# Patient Record
Sex: Male | Born: 1950 | Race: Black or African American | Hispanic: No | Marital: Married | Smoking: Current every day smoker
Health system: Southern US, Community
[De-identification: ages and names within clinical notes are randomized; demographics above are authoritative.]

## PROBLEM LIST (undated history)

## (undated) VITALS — BP 107/74 | HR 80 | Temp 98.1°F | Resp 16 | Ht 64.75 in | Wt 140.5 lb

## (undated) DIAGNOSIS — K219 Gastro-esophageal reflux disease without esophagitis: Secondary | ICD-10-CM

## (undated) DIAGNOSIS — E039 Hypothyroidism, unspecified: Secondary | ICD-10-CM

## (undated) DIAGNOSIS — R42 Dizziness and giddiness: Secondary | ICD-10-CM

## (undated) DIAGNOSIS — R351 Nocturia: Secondary | ICD-10-CM

## (undated) DIAGNOSIS — N200 Calculus of kidney: Secondary | ICD-10-CM

## (undated) DIAGNOSIS — F1020 Alcohol dependence, uncomplicated: Secondary | ICD-10-CM

## (undated) DIAGNOSIS — E079 Disorder of thyroid, unspecified: Secondary | ICD-10-CM

## (undated) DIAGNOSIS — I10 Essential (primary) hypertension: Secondary | ICD-10-CM

## (undated) DIAGNOSIS — F1910 Other psychoactive substance abuse, uncomplicated: Secondary | ICD-10-CM

## (undated) HISTORY — DX: Calculus of kidney: N20.0

## (undated) HISTORY — DX: Disorder of thyroid, unspecified: E07.9

## (undated) HISTORY — DX: Alcohol dependence, uncomplicated: F10.20

## (undated) HISTORY — DX: Other psychoactive substance abuse, uncomplicated: F19.10

---

## 1998-02-05 ENCOUNTER — Emergency Department (HOSPITAL_COMMUNITY): Admission: EM | Admit: 1998-02-05 | Discharge: 1998-02-05 | Payer: Self-pay | Admitting: Emergency Medicine

## 2004-01-25 ENCOUNTER — Emergency Department (HOSPITAL_COMMUNITY): Admission: EM | Admit: 2004-01-25 | Discharge: 2004-01-25 | Payer: Self-pay | Admitting: Emergency Medicine

## 2004-07-10 ENCOUNTER — Inpatient Hospital Stay (HOSPITAL_COMMUNITY): Admission: EM | Admit: 2004-07-10 | Discharge: 2004-07-14 | Payer: Self-pay | Admitting: Emergency Medicine

## 2005-06-11 ENCOUNTER — Ambulatory Visit: Payer: Self-pay | Admitting: Psychiatry

## 2005-06-11 ENCOUNTER — Inpatient Hospital Stay (HOSPITAL_COMMUNITY): Admission: RE | Admit: 2005-06-11 | Discharge: 2005-06-14 | Payer: Self-pay | Admitting: Psychiatry

## 2009-12-17 ENCOUNTER — Emergency Department (HOSPITAL_COMMUNITY): Admission: EM | Admit: 2009-12-17 | Discharge: 2009-12-17 | Payer: Self-pay | Admitting: Emergency Medicine

## 2010-11-24 ENCOUNTER — Inpatient Hospital Stay (INDEPENDENT_AMBULATORY_CARE_PROVIDER_SITE_OTHER)
Admission: RE | Admit: 2010-11-24 | Discharge: 2010-11-24 | Disposition: A | Discharge status: Home / Self Care | Payer: Managed Care, Other (non HMO) | Source: Ambulatory Visit | Attending: Family Medicine | Admitting: Family Medicine

## 2010-11-24 DIAGNOSIS — M1990 Unspecified osteoarthritis, unspecified site: Secondary | ICD-10-CM

## 2011-02-23 NOTE — Procedures (Signed)
CONCLUSION:  Abnormal EEG demonstrating excessive beta activity which may be  reflecting the Ativan.  No definite seizure discharges were noted and no  definite focal abnormalities were identified.  Clinical correlation is  recommended.      NGE:XBMW  D:  07/11/2004 15:44:25  T:  07/11/2004 16:13:55  Job #:  [id number]

## 2011-02-23 NOTE — H&P (Signed)
NAME:  JASHAD, DEPAULA NO.:  [id number]   MEDICAL RECORD NO.:  [id number]          PATIENT TYPE:  INP   LOCATION:  1831                         FACILITY:  [facility]   PHYSICIAN:  Sherin Quarry, MD      DATE OF BIRTH:  [birth year]   DATE OF ADMISSION:  07/10/2004  DATE OF DISCHARGE:                                HISTORY & PHYSICAL   HISTORY OF PRESENT ILLNESS:  Hutchinson Isenberg is a 60 year old man with a  longstanding history of alcohol abuse.  According to his wife, he has been  drinking especially heavy since last October.  She attributes this to the  death of his dog.  Remarkably, he continues to work at the [employer].  Two weeks ago he was admitted to [facility] where he remained for 4 days.  After getting out, his wife says that he abstained from alcohol for about 5  days and then resumed drinking very heavily, about a fifth per day.  He ran  out of hard liquor on Saturday.  On Sunday he consumed only three beers,  which represents a tremendous decrease in alcohol consumption.  Today, as he  was going to work, he did not drink any alcohol.  While at work, around 9  a.m., he experienced a generalized tonic-clonic seizure.  He was brought to  the emergency room where a second seizure occurred.  This was described as  well as a generalized tonic-clonic seizure lasting about 2 minutes.  He bit  his tongue during this episode.  He was administered 2 mg of Ativan IV.  Currently, he is somewhat lethargic but is alert.  He denies any recent  history of headache, fever, ear pain, sinus pain, coughing, breathing  difficulty, chest pain, nausea, or vomiting.  In the emergency room he has  also been noted to have an elevated blood pressure.  He states that he  believes he was told in the past that his blood pressure was elevated but he  has never received any treatment.  His wife says that he basically never  goes to doctors.  He is admitted at this time for evaluation of  what is  presumed to be an alcohol-withdrawal seizure.   PAST MEDICAL HISTORY:  1.  Medications:  None.  2.  Allergies:  None.  3.  Operations:  He recalls one occasion where he had a laceration repaired;      otherwise, he has essentially had no operations.  4.  Medical illnesses:  None except as noted above.   FAMILY HISTORY:  His father died as a result of COPD.  His mother was  treated for lung cancer.  He states that his siblings are in good health.   SOCIAL HISTORY:  He smokes one pack of cigarettes per day.  His alcohol  consumption is as described above.  He does not abuse drugs.  He lives with  his wife, who is very concerned about his well-being.   REVIEW OF SYSTEMS:  HEAD:  He denies headache or dizziness.  EYES:  He  denies visual blurring  or diplopia.  EAR, NOSE, AND THROAT:  Denies earache,  sinus pain, or sore throat.  CHEST:  Denies coughing, wheezing, or chest  congestion.  CARDIOVASCULAR:  Denies orthopnea, PND, or ankle edema.  GI:  Denies nausea, vomiting, abdominal pain, change in bowel habits, melena, or  hematochezia.  GU:  Denies dysuria or urinary frequency.  NEURO:  See above.  ENDO:  There is no history of excessive thirst, urinary frequency, nocturia,  heat or cold intolerance, skin or hair changes.   PHYSICAL EXAMINATION:  GENERAL:  He is a somewhat lethargic but otherwise  alert gentleman.  VITAL SIGNS:  His blood pressure is currently 168/95, pulse is 100,  respirations are 12 and unlabored.  HEENT:  Within normal limits.  CHEST:  Clear to auscultation and percussion.  CARDIOVASCULAR:  Reveals normal S1 and S2 without rubs, murmurs, or gallops.  ABDOMEN:  Benign.  There are normal bowel sounds with masses or tenderness.  There is no guarding or rebound.  NEUROLOGIC:  Cranial nerves, motor, sensory, and cerebellar testing is  normal.  Station and gait was not tested.  EXTREMITIES:  Revealed no evidence of cyanosis or edema.   LABORATORY DATA:   Laboratory studies were reviewed.  Chest x-ray was normal.  The patient had not had a CT scan of the brain as yet.   IMPRESSION:  1.  Alcohol-withdrawal seizures.  2.  Severe alcohol abuse.  3.  Probable essential hypertension.  4.  A 50 pack-year smoking history.   The patient will be admitted to the hospital at this time.  He will be  administered Ativan and thiamine by the intravenous route per Ativan alcohol  withdrawal protocol.  He was given 0.2 mg of p.o. Catapres and will be given  Catapres to maintain blood pressure in a reasonable range.  It will be  important to obtain a CT scan of the head to be sure that there are no signs  of any subdural blood collections, tumors, etc.  On a long-term basis, he  clearly needs to refrain from alcohol consumption.  It does not sound like  his recent visit to [facility] was very effective.  He also needs to follow up with  Dr. Manus Gunning in regards to his blood pressure.       SY/MEDQ  D:  07/10/2004  T:  07/10/2004  Job:  [id number]   cc:   Bryan Lemma. Manus Gunning, M.D.  [address] [place]  [place] [address]  Fax: [phone]

## 2011-02-23 NOTE — Discharge Summary (Signed)
NAME:  Philip [name], Philip [name] NO.:  [id number]   MEDICAL RECORD NO.:  [id number]          PATIENT TYPE:  IPS   LOCATION:  0302                          FACILITY[facility]  [facility]   PHYSICIAN:  Jeanice Lim, M.D. DATE OF BIRTH:  January 10, 1951   DATE OF ADMISSION:  06/11/2005  DATE OF DISCHARGE:  06/14/2005                                 DISCHARGE SUMMARY   IDENTIFYING DATA:  This is a 60 year old African-American male, married,  voluntarily admitted, with history of alcohol abuse for many years, relapsed  on alcohol after problems at work.  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  History of a seizure  disorder, off all medications for at least 6 months, wants to remain clean  and sober.  History of DT symptoms in the past.  Again, medical history  significant for seizure disorder, hypertension, and not taking seizure  medications.   ADMISSION MEDICATIONS:  Medications the patient is supposed to be on:  Dilantin, hydrochlorothiazide, Atenolol, and again off for 6 months.   ALLERGIES:  No known drug allergies.   PHYSICAL AND NEUROLOGICAL EXAMINATION:  Within normal limits.   ROUTINE ADMISSION LABS:  Within normal limits.   MENTAL STATUS EXAM:  Fully alert, calm, cooperative, blunted affect, some  tearfulness.  Speech normal, mood depressed, thought process goal directed,  a lot of shame and guilt regarding alcohol use, cognitively intact.  Judgment and insight were fair.   ADMISSION DIAGNOSES:  AXIS I:  Alcohol abuse versus dependence, likely  history of alcohol dependence, and cocaine abuse, history of polysubstance  abuse, possible substance-induced mood disorder versus depressive disorder  not otherwise specified.  AXIS II:  None.  AXIS III:  Seizure disorder, hypertension, hypokalemia.  AXIS IV:  Severe, problems with occupation, may lose  job, financial stress,  and limited support system.  AXIS V:  30/60.   HOSPITAL COURSE:  The patient was admitted and ordered routine p.r.n.  medications, underwent further monitoring, and was encouraged to participate  in individual, group and milieu therapy.  The patient was placed on seizure  precautions, potassium repleted, hydrochlorothiazide initially held,  Dilantin restarted and level checked.  The patient was given thiamine and  monitored for withdrawal symptoms due to high risk.  The patient reported  doing better, with improvement in sleep and appetite, had no complications  of withdrawal including no seizure activity, and was discharged in improved  condition.  Mood was euthymic, affect full, no psychotic symptoms or  dangerous ideation, no acute withdrawal symptoms.  The patient reported  motivation to be compliant with followup and remain abstinent.  He again was  given medication education, emphasized regarding the importance of  compliance with medications and he was discharged on:  1.  Dilantin 100 mg b.i.d.  2.  Tenormin 25 mg b.i.d.  3.  Hydrochlorothiazide 25 mg daily.  4.  Trazodone 75 mg q.h.s.   DISPOSITION:  The patient was to follow up with  AA.  The patient refused any  other formal substance abuse followup at this time but reported he had done  well with AA in the past and intended to be very involved again.  The  patient's condition was improved, prognosis was guarded in light of the  patient's unwillingness to set up a more comprehensive aftercare plan.      Jeanice Lim, M.D.  Electronically Signed     JEM/MEDQ  D:  07/29/2005  T:  07/29/2005  Job:  [id number]

## 2011-02-23 NOTE — Discharge Summary (Signed)
NAME:  Philip [name], GAPPA NO.:  [id number]   MEDICAL RECORD NO.:  [id number]          PATIENT TYPE:  INP   LOCATION:  5019                         FACILITY:  [facility]   PHYSICIAN:  Hollice Espy, M.D.DATE OF BIRTH:  Sep 24, 1951   DATE OF ADMISSION:  07/10/2004  DATE OF DISCHARGE:  07/14/2004                                 DISCHARGE SUMMARY   PRIMARY CARE PHYSICIAN:  The patient sees Dr. Maurice Small.   DISCHARGE DIAGNOSES:  1.  Alcohol abuse and withdrawal.  2.  Seizures secondary to #1.  3.  History of hypertension.  4.  History of tobacco abuse.   DISCHARGE MEDICATIONS FOR THIS PATIENT ARE AS FOLLOWS:  1.  Thiamine 100 mg p.o. daily.  2.  Multivitamin p.o. daily.  3.  Folic acid 1 mg p.o. daily.  4.  Dilantin 300 mg p.o. q.h.s.  5.  Ativan 1 mg p.o. on July 15, 2004.   Patient is a 60 year old African-American male with a past medical history  of alcohol abuse who was found passed out at work this morning.  He did not  remember falling or what had happened prior to his fall.  He was brought in  to the emergency room where he had a witnessed seizure.  The patient had a  history of alcohol abuse and had previous history of withdrawals when he  would stop drinking.  Patient apparently began drinking 7 days ago a fifth  of liquor and he also uses cocaine, most recently that morning.  Patient was  admitted for alcohol withdrawal, seizures likely secondary to his  withdrawal.  In addition, a CT scan of his head showed a possible right  frontal hemorrhage.  Neurology, Dr. Pearlean Brownie, was consulted to see if this was  some type of hemorrhage causing seizures or more of an alcohol withdrawal  seizure as well.  The patient was evaluated in the emergency room by  neurology.  He was loaded with Dilantin and continued on a continued dose.  In addition, he also underwent a repeat head CT and EEG.  Patient was doing  relatively well; however, he still remained unsteady  and he was found out of  his bed delirious on the second day.  He was continued on the Ativan  protocol.  He had no further seizures during this time.  An EEG was done as  well as a repeat CT of the head.  The EEG showed excessive beta activity,  otherwise fairly unremarkable.  CT of the head confirmed a contusion at the  right frontal area.  It was felt then that his new onset seizure disorder  was felt to be secondary to a cerebral contusion, alcohol withdrawal and  cocaine binging multifactorial.  The patient was on a tapering dose of  Ativan which he tolerated well, his confusion was improving and by July 14, 2004, he was able to be upright and alert, he was able to be ambulated  well without assistance and felt to be medically stable for discharge.   PLAN:  For him to followup with [facility], which  he has been given  numbers for.  Continue his medications as well as Ativan taper for one more  day as supervised by his wife.  He is, obviously, advised that he can longer  drive, and I have spoken with his wife about that it would be illegal for  him to drive.  She says she  understands this and she will make sure that he obtains a ride from a friend  or from her when trying to get around.  In addition, patient's disposition  is improved, activity will be as tolerated and he will followup with  [facility].  He is being discharged to home.      Send   SKK/MEDQ  D:  08/12/2004  T:  08/13/2004  Job:  [id number]

## 2011-02-23 NOTE — Consult Note (Signed)
NAME:  Philip [name], Philip [name]               ACCOUNT NO.:  [id number]   MEDICAL RECORD NO.:  [id number]          PATIENT TYPE:  INP   LOCATION:  3109                         FACILITY:  [facility]   PHYSICIAN:  Pramod P. Pearlean Brownie, MD    DATE OF BIRTH:  1951-02-06   DATE OF CONSULTATION:  07/10/2004  DATE OF DISCHARGE:                                   CONSULTATION   CONSULTING PHYSICIAN:  Pramod P. Pearlean Brownie, M.D.   REFERRING PHYSICIAN:  Sherin Quarry, M.D.   REASON FOR CONSULTATION:  The patient is a 65 year old African-American  gentleman who was found unresponsive at work today.  He was found to be  drowsy and moving all four extremities. Upon arrival in the emergency room,  he had a second witnessed generalized tonic clonic seizure.  He has since  been post ictal and slowly improving.  The patient does have a history of  drug abuse and admits to having snorted cocaine this a.m. His urine drug  screen was positive for cocaine, but an alcohol level was not detectable.  The patient does have a history of heavy alcohol abuse and admits to  drinking about a fifth of alcohol every day.  He has a remote history of  seizures about 20 years ago, but since then he has been seizure free and not  on any medications for seizures.   PAST MEDICAL HISTORY:  1.  Heavy alcohol abuse.  2.  Substance abuse.  3.  Hypertension.  4.  Smoking.   MEDICATIONS:  None.   ALLERGIES:  None.   PAST SURGICAL HISTORY:  None.   REVIEW OF SYMPTOMS:  GENERAL:  Not significant for recent fever, cough,  chest pain or diarrhea. PSYCHIATRIC:  He does have a history of giving up  alcohol, but restarted a few weeks ago.   FAMILY HISTORY:  Noncontributory.   PHYSICAL EXAMINATION:  GENERAL: Physical examination reveals a pleasant  African-American gentleman who is not in distress.  VITAL SIGNS:  He is afebrile, pulse rate is 84, respiratory rate 16,  saturations are 98% on two liters of oxygen and blood pressure is 162/89.  HEENT:  The head is normocephalic, atraumatic.  The neck is supple without  bruit.  Nasotracheal exam is unremarkable.  CARDIAC EXAM:  No murmur or gallop.  LUNGS:  Clear to auscultation.  NEUROLOGIC EXAM:  The patient is drowsy, but opens his eyes and follows  commands well and appropriately.  There is no slurred speech, dysphagia or  dysarthria.  Pupils equal, round and reactive to light and accommodation.  Visual acuity appears adequate.  The face is symmetric.  The palate elevates  normally. The tongue is midline. Motor exam reveals no upper extremity  weakness or drift.  Symmetric strength, tone, reflexes, coordination and  sensation.  Plantars are downgoing. Gait was not tested.   LABORATORY DATA:  Chest x-ray is normal.   Blood glucose, creatinine and electrolytes are normal except for a low  potassium of 3.0.  Urine drug screen is positive for cocaine.  Alcohol level  is not detectable.   CT scan of the  head, non-contrast study, reveals a patchy high density in  the right frontal region which may be a small hemorrhage or contusion though  there is no underlying mass affect or midline shift.   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   PLAN:  1.  I would agree with treatment with phenytoin for his symptomatic      seizures.  2.  Continue phenytoin 300 mg a day and check level tomorrow morning.  3.  Aim for a level of 15 to 20 mg.  4.  Check an electroencephalogram.  5.  Repeat CT scan of the head in the morning to look for any interval      change in the contusion size.  6.  Also check CT angiogram of the brain to look for evidence of any      vasculitis.  7.  Consult the patient regarding alcohol and drugs.  8.  Watch for signs of alcohol withdrawal and treat appropriately.  9.  I  will be happy to follow the patient in consult.  Kindly call for      questions.  I had a long discussion with the patient and his wife      regarding his symptoms. I have discussed the plan for evaluation and      treatment.       PPS/MEDQ  D:  07/10/2004  T:  07/10/2004  Job:  [id number]   cc:   Sherin Quarry, MD

## 2011-03-23 ENCOUNTER — Inpatient Hospital Stay (INDEPENDENT_AMBULATORY_CARE_PROVIDER_SITE_OTHER)
Admission: RE | Admit: 2011-03-23 | Discharge: 2011-03-23 | Disposition: A | Discharge status: Home / Self Care | Payer: Managed Care, Other (non HMO) | Source: Ambulatory Visit | Attending: Family Medicine | Admitting: Family Medicine

## 2011-03-23 DIAGNOSIS — J069 Acute upper respiratory infection, unspecified: Secondary | ICD-10-CM

## 2011-04-24 ENCOUNTER — Other Ambulatory Visit: Payer: Self-pay | Admitting: Occupational Medicine

## 2011-04-24 ENCOUNTER — Ambulatory Visit: Payer: Self-pay

## 2011-04-24 DIAGNOSIS — R52 Pain, unspecified: Secondary | ICD-10-CM

## 2011-09-11 ENCOUNTER — Emergency Department (HOSPITAL_COMMUNITY)
Admission: EM | Admit: 2011-09-11 | Discharge: 2011-09-11 | Disposition: A | Discharge status: Home / Self Care | Payer: Managed Care, Other (non HMO) | Source: Home / Self Care

## 2011-10-30 ENCOUNTER — Ambulatory Visit (INDEPENDENT_AMBULATORY_CARE_PROVIDER_SITE_OTHER): Payer: BC Managed Care – PPO

## 2011-10-30 DIAGNOSIS — I10 Essential (primary) hypertension: Secondary | ICD-10-CM

## 2011-10-30 DIAGNOSIS — T65291A Toxic effect of other tobacco and nicotine, accidental (unintentional), initial encounter: Secondary | ICD-10-CM

## 2012-02-03 ENCOUNTER — Ambulatory Visit (INDEPENDENT_AMBULATORY_CARE_PROVIDER_SITE_OTHER): Payer: BC Managed Care – PPO | Admitting: Family Medicine

## 2012-02-03 VITALS — BP 150/88 | HR 71 | Temp 97.7°F | Resp 18 | Ht 66.5 in | Wt 142.0 lb

## 2012-02-03 DIAGNOSIS — I10 Essential (primary) hypertension: Secondary | ICD-10-CM

## 2012-02-03 LAB — POCT CBC
Granulocyte percent: 55.8 %G (ref 37–80)
HCT, POC: 46.8 % (ref 43.5–53.7)
Hemoglobin: 15.3 g/dL (ref 14.1–18.1)
Lymph, poc: 2.3 (ref 0.6–3.4)
MCH, POC: 27.2 pg (ref 27–31.2)
MCHC: 32.7 g/dL (ref 31.8–35.4)
MCV: 83.3 fL (ref 80–97)
MID (cbc): 0.5 (ref 0–0.9)
MPV: 8.2 fL (ref 0–99.8)
POC Granulocyte: 3.5 (ref 2–6.9)
POC LYMPH PERCENT: 36.8 %L (ref 10–50)
POC MID %: 7.4 %M (ref 0–12)
Platelet Count, POC: 426 10*3/uL — AB (ref 142–424)
RBC: 5.62 M/uL (ref 4.69–6.13)
RDW, POC: 14.1 %
WBC: 6.2 10*3/uL (ref 4.6–10.2)

## 2012-02-03 LAB — COMPREHENSIVE METABOLIC PANEL
ALT: 14 U/L (ref 0–53)
AST: 15 U/L (ref 0–37)
Albumin: 4.3 g/dL (ref 3.5–5.2)
Alkaline Phosphatase: 144 U/L — ABNORMAL HIGH (ref 39–117)
BUN: 13 mg/dL (ref 6–23)
CO2: 25 mEq/L (ref 19–32)
Calcium: 9.8 mg/dL (ref 8.4–10.5)
Chloride: 101 mEq/L (ref 96–112)
Creat: 0.57 mg/dL (ref 0.50–1.35)
Glucose, Bld: 98 mg/dL (ref 70–99)
Potassium: 4.4 mEq/L (ref 3.5–5.3)
Sodium: 136 mEq/L (ref 135–145)
Total Bilirubin: 0.3 mg/dL (ref 0.3–1.2)
Total Protein: 7.2 g/dL (ref 6.0–8.3)

## 2012-02-03 LAB — LDL CHOLESTEROL, DIRECT: Direct LDL: 100 mg/dL — ABNORMAL HIGH

## 2012-02-03 MED ORDER — LOSARTAN POTASSIUM-HCTZ 100-25 MG PO TABS: 1.0000 | ORAL_TABLET | Freq: Every day | ORAL | Status: DC

## 2012-02-03 NOTE — Progress Notes (Signed)
  Patient Name: Philip [name] Date of Birth: [birth year] Medical Record Number: [id number] Gender: male Date of Encounter: 02/03/2012  History of Present Illness:  Philip [name] is a 61 y.o. very pleasant male patient who presents with the following:  Here for a refill of his BP medication.  He has been taking his medication faithfully.  He has also stopped drinking for the last couple of months.  He continues to smoke but is working on stopping this too.  He does not do home checks of BP.  Not fasting currently  There is no problem list on file for this patient.  No past medical history on file. No past surgical history on file. History  Substance Use Topics  . Smoking status: Current Everyday Smoker -- 1.0 packs/day    Types: Cigarettes  . Smokeless tobacco: Not on file  . Alcohol Use: No     former drinker   No family history on file. No Known Allergies  Medication list has been reviewed and updated.  Review of Systems: As per HPI- otherwise negative.   Physical Examination: Filed Vitals:   02/03/12 1405 02/03/12 1416  BP: 175/94 150/88  Pulse: 71   Temp: 97.7 F (36.5 C)   TempSrc: Oral   Resp: 18   Height: 5' 6.5" (1.689 m)   Weight: 142 lb (64.411 kg)     Body mass index is 22.58 kg/(m^2).  GEN: WDWN, NAD, Non-toxic, A & O x 3, tobacco odor HEENT: Atraumatic, Normocephalic. Neck supple. No masses, No LAD. Ears and Nose: No external deformity. CV: RRR, No M/G/R. No JVD. No thrill. No extra heart sounds. PULM: CTA B, no wheezes, crackles, rhonchi. No retractions. No resp. distress. No accessory muscle use. ABD: S, NT, ND, +BS. No rebound. No HSM. EXTR: No c/c/e NEURO Normal gait.  PSYCH: Normally interactive. Conversant. Not depressed or anxious appearing.  Calm demeanor.    Assessment and Plan: 1. Hypertension  losartan-hydrochlorothiazide (HYZAAR) 100-25 MG per tablet, POCT CBC, Comprehensive metabolic panel, LDL cholesterol, direct    Increased  dose of medication as above.  From 100/12.5 to 100/25.  Please come and see [place] in a couple of months for a recheck, and good luck with quitting smoking!  He plans to try patches to achieve this goal

## 2012-02-04 ENCOUNTER — Encounter: Payer: Self-pay | Admitting: Family Medicine

## 2012-02-05 ENCOUNTER — Telehealth: Payer: Self-pay

## 2012-02-05 NOTE — Telephone Encounter (Signed)
PT STATES DR COPLAND CHANGED HIS BP MED, BUT HE SAYS WHEN HE WENT AND PICKED IT UP IT WAS 100-12.5. CAN WE JUST CALL THE PHARMACY AND FIX THIS?  [facility] [address]

## 2012-02-05 NOTE — Telephone Encounter (Signed)
Pt has questions about his blood pressure medication and that he believes that the miligrams should have been increased but when he picked them up from the pharmacy they were the same as before

## 2012-02-06 NOTE — Telephone Encounter (Signed)
Please call pharmacy and adjust dose.  Should be 100/25 per chart

## 2012-02-06 NOTE — Telephone Encounter (Signed)
[facility] and they corrected rx. Called pt and lmom for him.

## 2012-02-07 ENCOUNTER — Encounter: Payer: Self-pay | Admitting: Family Medicine

## 2012-03-02 ENCOUNTER — Other Ambulatory Visit: Payer: Self-pay | Admitting: Family Medicine

## 2012-03-05 ENCOUNTER — Other Ambulatory Visit: Payer: Self-pay | Admitting: Family Medicine

## 2012-04-14 ENCOUNTER — Ambulatory Visit (INDEPENDENT_AMBULATORY_CARE_PROVIDER_SITE_OTHER): Payer: BC Managed Care – PPO | Admitting: Family Medicine

## 2012-04-14 VITALS — BP 110/82 | HR 120 | Temp 98.0°F | Resp 18 | Ht 64.5 in | Wt 147.0 lb

## 2012-04-14 DIAGNOSIS — I10 Essential (primary) hypertension: Secondary | ICD-10-CM

## 2012-04-14 DIAGNOSIS — K5289 Other specified noninfective gastroenteritis and colitis: Secondary | ICD-10-CM

## 2012-04-14 DIAGNOSIS — K529 Noninfective gastroenteritis and colitis, unspecified: Secondary | ICD-10-CM

## 2012-04-14 MED ORDER — METRONIDAZOLE 250 MG PO TABS: 250.0000 mg | ORAL_TABLET | Freq: Two times a day (BID) | ORAL | Status: DC

## 2012-04-14 NOTE — Progress Notes (Signed)
61 yo man with 3 days of nausea, cramps, diarrhea and vomiting after eating at a cookout on July 5th.  He is now feeling better.  Cramps have subsided by diarrhea continues He had been weak, but is now better.  He works for [employer].  Still smoking, but has cut back a bit.  O:  Alert, NAD HEENT:  Unremarkable Chest:  Clear Heart: reg, no murmur Abd:  No bruit, soft, hyperactive BS Ext:  No edema  A:  Hypertension, gastroenteritis  1. Gastroenteritis  metroNIDAZOLE (FLAGYL) 250 MG tablet

## 2012-04-21 ENCOUNTER — Ambulatory Visit (INDEPENDENT_AMBULATORY_CARE_PROVIDER_SITE_OTHER): Payer: BC Managed Care – PPO | Admitting: Family Medicine

## 2012-04-21 ENCOUNTER — Ambulatory Visit: Payer: BC Managed Care – PPO

## 2012-04-21 VITALS — BP 150/90 | HR 114 | Temp 98.6°F | Resp 18 | Ht 67.0 in | Wt 145.0 lb

## 2012-04-21 DIAGNOSIS — R071 Chest pain on breathing: Secondary | ICD-10-CM

## 2012-04-21 DIAGNOSIS — R0789 Other chest pain: Secondary | ICD-10-CM

## 2012-04-21 MED ORDER — OXAPROZIN 600 MG PO TABS: ORAL_TABLET | ORAL | Status: DC

## 2012-04-21 NOTE — Patient Instructions (Signed)
Try to avoid straining chest wall.  REturn if worse  Quit smoking!

## 2012-04-21 NOTE — Progress Notes (Signed)
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 Over the last week she's been having pain in his right chest wall. Hurts with certain movements at times. The pain is anteriorly and axillary line. Knows of no specific injuries. He does have high blood pressure does smoke. No dyspnea or diaphoresis or nausea or vomiting.  Objective: Chest clear to auscultation. Neck supple without nodes. Heart regular without murmurs gallops or arrhythmias. Split S1-S2. It is soft without mass or tenderness. Right chest wall does seem a little tender on right primarily.    Assessment: Chest wall pain Atypical chest pain  Plan  right ribs x-ray and EKG  [facility] reading (PRIMARY) by  Dr. Alwyn Ren Normal ribs and lungs  EKG suggests poor R wave progression ? Old AMI  Told to quit smoking  daypro 300 bid   .

## 2012-04-22 ENCOUNTER — Encounter (HOSPITAL_COMMUNITY): Payer: Self-pay | Admitting: *Deleted

## 2012-04-22 ENCOUNTER — Emergency Department (HOSPITAL_COMMUNITY)
Admission: EM | Admit: 2012-04-22 | Discharge: 2012-04-23 | Disposition: A | Discharge status: Other Acute Inpatient Hospital | Payer: BC Managed Care – PPO | Attending: Emergency Medicine | Admitting: Emergency Medicine

## 2012-04-22 DIAGNOSIS — I10 Essential (primary) hypertension: Secondary | ICD-10-CM | POA: Insufficient documentation

## 2012-04-22 DIAGNOSIS — F1410 Cocaine abuse, uncomplicated: Secondary | ICD-10-CM | POA: Insufficient documentation

## 2012-04-22 DIAGNOSIS — F1010 Alcohol abuse, uncomplicated: Secondary | ICD-10-CM | POA: Insufficient documentation

## 2012-04-22 HISTORY — DX: Essential (primary) hypertension: I10

## 2012-04-22 LAB — COMPREHENSIVE METABOLIC PANEL
ALT: 22 U/L (ref 0–53)
AST: 23 U/L (ref 0–37)
Albumin: 3.6 g/dL (ref 3.5–5.2)
Alkaline Phosphatase: 125 U/L — ABNORMAL HIGH (ref 39–117)
BUN: 5 mg/dL — ABNORMAL LOW (ref 6–23)
CO2: 26 mEq/L (ref 19–32)
Calcium: 8.9 mg/dL (ref 8.4–10.5)
Chloride: 89 mEq/L — ABNORMAL LOW (ref 96–112)
Creatinine, Ser: 0.53 mg/dL (ref 0.50–1.35)
GFR calc Af Amer: >90 mL/min (ref >90)
GFR calc non Af Amer: >90 mL/min (ref >90)
Glucose, Bld: 114 mg/dL — ABNORMAL HIGH (ref 70–99)
Potassium: 3.2 mEq/L — ABNORMAL LOW (ref 3.5–5.1)
Sodium: 128 mEq/L — ABNORMAL LOW (ref 135–145)
Total Bilirubin: 0.3 mg/dL (ref 0.3–1.2)
Total Protein: 7.5 g/dL (ref 6.0–8.3)

## 2012-04-22 LAB — RAPID URINE DRUG SCREEN, HOSP PERFORMED
Amphetamines: NOT DETECTED
Barbiturates: NOT DETECTED
Benzodiazepines: POSITIVE — AB
Cocaine: NOT DETECTED
Opiates: NOT DETECTED
Tetrahydrocannabinol: NOT DETECTED

## 2012-04-22 LAB — CBC
HCT: 45.1 % (ref 39.0–52.0)
Hemoglobin: 16.1 g/dL (ref 13.0–17.0)
MCH: 28.6 pg (ref 26.0–34.0)
MCHC: 35.7 g/dL (ref 30.0–36.0)
MCV: 80.2 fL (ref 78.0–100.0)
Platelets: 289 10*3/uL (ref 150–400)
RBC: 5.62 MIL/uL (ref 4.22–5.81)
RDW: 14.7 % (ref 11.5–15.5)
WBC: 7 10*3/uL (ref 4.0–10.5)

## 2012-04-22 LAB — ETHANOL: Alcohol, Ethyl (B): 183 mg/dL — ABNORMAL HIGH (ref 0–11)

## 2012-04-22 MED ORDER — LORAZEPAM 1 MG PO TABS
0.0000 mg | ORAL_TABLET | Freq: Four times a day (QID) | ORAL | Status: DC
Start: 2012-04-22 — End: 2012-04-23
  Administered 2012-04-22: 1 mg via ORAL
  Filled 2012-04-22: qty 1

## 2012-04-22 MED ORDER — ALUM & MAG HYDROXIDE-SIMETH 200-200-20 MG/5ML PO SUSP: 30.0000 mL | ORAL | Status: DC | PRN

## 2012-04-22 MED ORDER — LOSARTAN POTASSIUM 50 MG PO TABS
100.0000 mg | ORAL_TABLET | Freq: Every day | ORAL | Status: DC
  Filled 2012-04-22 (×2): qty 2

## 2012-04-22 MED ORDER — LOSARTAN POTASSIUM-HCTZ 100-25 MG PO TABS: 1.0000 | ORAL_TABLET | Freq: Every day | ORAL | Status: DC

## 2012-04-22 MED ORDER — ONDANSETRON HCL 4 MG PO TABS: 4.0000 mg | ORAL_TABLET | Freq: Three times a day (TID) | ORAL | Status: DC | PRN

## 2012-04-22 MED ORDER — HYDROCHLOROTHIAZIDE 25 MG PO TABS
25.0000 mg | ORAL_TABLET | Freq: Every day | ORAL | Status: DC
  Filled 2012-04-22 (×2): qty 1

## 2012-04-22 MED ORDER — IBUPROFEN 600 MG PO TABS: 600.0000 mg | ORAL_TABLET | Freq: Three times a day (TID) | ORAL | Status: DC | PRN

## 2012-04-22 MED ORDER — THIAMINE HCL 100 MG/ML IJ SOLN: 100.0000 mg | Freq: Every day | INTRAMUSCULAR | Status: DC

## 2012-04-22 MED ORDER — POTASSIUM CHLORIDE 20 MEQ/15ML (10%) PO LIQD
20.0000 meq | Freq: Once | ORAL | Status: AC
  Administered 2012-04-22: 20 meq via ORAL
  Filled 2012-04-22 (×2): qty 15

## 2012-04-22 MED ORDER — LORAZEPAM 2 MG/ML IJ SOLN: 1.0000 mg | Freq: Four times a day (QID) | INTRAMUSCULAR | Status: DC | PRN

## 2012-04-22 MED ORDER — ADULT MULTIVITAMIN W/MINERALS CH
1.0000 | ORAL_TABLET | Freq: Every day | ORAL | Status: DC
  Administered 2012-04-22: 1 via ORAL
  Filled 2012-04-22: qty 1

## 2012-04-22 MED ORDER — LORAZEPAM 1 MG PO TABS: 1.0000 mg | ORAL_TABLET | Freq: Four times a day (QID) | ORAL | Status: DC | PRN

## 2012-04-22 MED ORDER — LORAZEPAM 1 MG PO TABS: 1.0000 mg | ORAL_TABLET | Freq: Three times a day (TID) | ORAL | Status: DC | PRN

## 2012-04-22 MED ORDER — LORAZEPAM 1 MG PO TABS
0.0000 mg | ORAL_TABLET | Freq: Two times a day (BID) | ORAL | Status: DC
Start: 2012-04-24 — End: 2012-04-23

## 2012-04-22 MED ORDER — ZOLPIDEM TARTRATE 5 MG PO TABS
5.0000 mg | ORAL_TABLET | Freq: Every evening | ORAL | Status: DC | PRN
  Administered 2012-04-22: 5 mg via ORAL
  Filled 2012-04-22: qty 1

## 2012-04-22 MED ORDER — FOLIC ACID 1 MG PO TABS
1.0000 mg | ORAL_TABLET | Freq: Every day | ORAL | Status: DC
  Administered 2012-04-22: 1 mg via ORAL
  Filled 2012-04-22: qty 1

## 2012-04-22 MED ORDER — NICOTINE 21 MG/24HR TD PT24
21.0000 mg | MEDICATED_PATCH | Freq: Every day | TRANSDERMAL | Status: DC
  Administered 2012-04-22: 21 mg via TRANSDERMAL
  Filled 2012-04-22: qty 1

## 2012-04-22 MED ORDER — VITAMIN B-1 100 MG PO TABS
100.0000 mg | ORAL_TABLET | Freq: Every day | ORAL | Status: DC
  Administered 2012-04-22: 100 mg via ORAL
  Filled 2012-04-22: qty 1

## 2012-04-22 NOTE — ED Notes (Signed)
Wife at bedside, nad

## 2012-04-22 NOTE — ED Notes (Signed)
Wife at bedside.

## 2012-04-22 NOTE — ED Notes (Signed)
Pt reports wanting detox from alcohol- last drink this am. Drinks approx 6 pack beer/[place] iced tea daily. Relapsed approx 1-2 month ago. Denies SI/HI.

## 2012-04-22 NOTE — ED Notes (Signed)
Wife took pt's belongings home with her. 

## 2012-04-22 NOTE — BH Assessment (Signed)
Assessment Note   Philip [name] is a 61 y.o. male who presents to [facility] for detox from alcohol.  Pt reports drinking 1/2 gal of [employer] daily, 12 pk daily and using $40 daily of cocaine.  Pt.'s last alcohol intake was 04/22/12 and last cocaine intake was 04/19/12.  Pt denies SI/HI/Psych.  Denies any current sxs,but says he has experienced w/d sxs(tremors, sweats) in the past.  No issues with Blackouts or Seizures.  Pt has has detox in the past in 2006 with [place], [place] and [facility].    Axis I: Alcohol Dep; Cocaine Abuse  Axis II: Deferred Axis III:  Past Medical History  Diagnosis Date  . Hypertension    Axis IV: other psychosocial or environmental problems, problems related to social environment and problems with primary support group Axis V: 51-60 moderate symptoms  Past Medical History:  Past Medical History  Diagnosis Date  . Hypertension     History reviewed. No pertinent past surgical history.  Family History: No family history on file.  Social History:  reports that he has been smoking Cigarettes.  He has been smoking about 1 pack per day. He does not have any smokeless tobacco history on file. He reports that he drinks alcohol. He reports that he uses illicit drugs (Cocaine).  Additional Social History:  Alcohol / Drug Use Pain Medications: See attached rx list  Prescriptions: See attached rx list  Over the Counter: See attached rx list  Longest period of sobriety (when/how long): Various dates of sobriety Negative Consequences of Use: Personal relationships Withdrawal Symptoms: Other (Comment) (No current w/d sxs )  CIWA: CIWA-Ar BP: 132/92 mmHg Pulse Rate: 104  Nausea and Vomiting: no nausea and no vomiting Tactile Disturbances: none Tremor: no tremor Auditory Disturbances: not present Paroxysmal Sweats: no sweat visible Visual Disturbances: not present Anxiety: no anxiety, at ease Headache, Fullness in Head: none present Agitation: normal  activity Orientation and Clouding of Sensorium: oriented and can do serial additions CIWA-Ar Total: 0  COWS: Clinical Opiate Withdrawal Scale (COWS) Resting Pulse Rate: Pulse Rate 101-120 Sweating: No report of chills or flushing Restlessness: Able to sit still Pupil Size: Pupils pinned or normal size for room light Bone or Joint Aches: Not present Runny Nose or Tearing: Not present GI Upset: No GI symptoms Tremor: No tremor Yawning: No yawning Anxiety or Irritability: None Gooseflesh Skin: Skin is smooth COWS Total Score: 2   Allergies: No Known Allergies  Home Medications:  (Not in a hospital admission)  OB/GYN Status:  No LMP for male patient.  General Assessment Data Location of Assessment: [facility] Living Arrangements: Spouse/significant other Can pt return to current living arrangement?: Yes Admission Status: Voluntary Is patient capable of signing voluntary admission?: Yes Transfer from: Acute Hospital Referral Source: MD  Education Status Is patient currently in school?: No Current Grade: None  Highest grade of school patient has completed: None  Name of school: None  Contact person: None   Risk to self Suicidal Ideation: No Suicidal Intent: No Is patient at risk for suicide?: No Suicidal Plan?: No Access to Means: No What has been your use of drugs/alcohol within the last 12 months?: Abusing alcohol and cocaine  Previous Attempts/Gestures: No How many times?: 0  Other Self Harm Risks: None  Triggers for Past Attempts: Other (Comment) (SA) Intentional Self Injurious Behavior: None Family Suicide History: No Recent stressful life event(s): Other (Comment) (Chronic SA) Persecutory voices/beliefs?: No Depression: Yes Depression Symptoms: Loss of interest in  usual pleasures;Feeling worthless/self pity Substance abuse history and/or treatment for substance abuse?: Yes Suicide prevention information given to non-admitted patients: Not applicable  Risk to  Others Homicidal Ideation: No Thoughts of Harm to Others: No Current Homicidal Intent: No Current Homicidal Plan: No Access to Homicidal Means: No Identified Victim: None  History of harm to others?: No Assessment of Violence: None Noted Violent Behavior Description: None  Does patient have access to weapons?: No Criminal Charges Pending?: No Does patient have a court date: No  Psychosis Hallucinations: None noted Delusions: None noted  Mental Status Report Appear/Hygiene: Other (Comment) (Appropriate ) Eye Contact: Good Motor Activity: Unremarkable Speech: Logical/coherent Level of Consciousness: Alert Mood: Depressed Affect: Depressed Anxiety Level: None Thought Processes: Coherent;Relevant Judgement: Unimpaired Orientation: Person;Place;Time;Situation Obsessive Compulsive Thoughts/Behaviors: None  Cognitive Functioning Concentration: Normal Memory: Recent Intact;Remote Intact IQ: Average Insight: Fair Impulse Control: Fair Appetite: Good Weight Loss: 0  Weight Gain: 0  Sleep: No Change Total Hours of Sleep: 8  Vegetative Symptoms: None  ADLScreening [facility] Assessment Services) Patient's cognitive ability adequate to safely complete daily activities?: Yes Patient able to express need for assistance with ADLs?: Yes Independently performs ADLs?: Yes  Abuse/Neglect [facility]) Physical Abuse: Denies Verbal Abuse: Denies Sexual Abuse: Denies  Prior Inpatient Therapy Prior Inpatient Therapy: Yes Prior Therapy Dates: 2006 Prior Therapy Facilty/Provider(s): [facility], [place], [place] Reason for Treatment: SA/Detox /Rehab   Prior Outpatient Therapy Prior Outpatient Therapy: No Prior Therapy Dates: None  Prior Therapy Facilty/Provider(s): None  Reason for Treatment: None   ADL Screening (condition at time of admission) Patient's cognitive ability adequate to safely complete daily activities?: Yes Patient able to express need for assistance with ADLs?:  Yes Independently performs ADLs?: Yes Weakness of Legs: None Weakness of Arms/Hands: None  Home Assistive Devices/Equipment Home Assistive Devices/Equipment: None  Therapy Consults (therapy consults require a physician order) PT Evaluation Needed: No OT Evalulation Needed: No SLP Evaluation Needed: No Abuse/Neglect Assessment (Assessment to be complete while patient is alone) Physical Abuse: Denies Verbal Abuse: Denies Sexual Abuse: Denies Exploitation of patient/patient's resources: Denies Self-Neglect: Denies Values / Beliefs Cultural Requests During Hospitalization: None Spiritual Requests During Hospitalization: None Consults Spiritual Care Consult Needed: No Social Work Consult Needed: No Merchant navy officer (For Healthcare) Advance Directive: Patient does not have advance directive;Patient would not like information Pre-existing out of facility DNR order (yellow form or pink MOST form): No Nutrition Screen Diet: Regular Unintentional weight loss greater than 10lbs within the last month: No Problems chewing or swallowing foods and/or liquids: No Home Tube Feeding or Total Parenteral Nutrition (TPN): No Patient appears severely malnourished: No  Additional Information 1:1 In Past 12 Months?: No CIRT Risk: No Elopement Risk: No Does patient have medical clearance?: Yes     Disposition:  Disposition Disposition of Patient: Inpatient treatment program;Referred to [facility] ) Type of inpatient treatment program: Adult Patient referred to: Other (Comment) [facility] )  On Site Evaluation by:   Reviewed with Physician:     Murrell Redden 04/22/2012 10:42 PM

## 2012-04-22 NOTE — ED Notes (Signed)
Wife in to see 

## 2012-04-22 NOTE — ED Provider Notes (Signed)
History     CSN: [id number]  Arrival date & time 04/22/12  1419   First MD Initiated Contact with Patient 04/22/12 1426      No chief complaint on file.   (Consider location/radiation/quality/duration/timing/severity/associated sxs/prior treatment) HPI  H/o etoh abuse pw request for detox from alcohol. Was clean for 2 years but relapsed 2-3 months ago 8-9 beers per day, gin one fifth per day, and [place] iced tea 1/2 gallon per day. Last drink outside just pta. Has experienced w/d sx in past including seizures 7 years ago, none currently. Reports dec appetite. C/O Rt rib pain. Unsure if he blacked out and fell over one week ago. Denies shortness of breath. +Nausea. Min vomiting this morning- states it looks like beer. Denies abdominal pain, back pain. Denies SI/HI/AVH. Feels that he needs inpatient/residential treatment for his substance abuse.  H/o HTN, denies HLD, +smoker, fmhx- no CAD   ED Notes, ED Provider Notes from 04/22/12 0000 to 04/22/12 14:25:54       Berkley Mangum Trevino, RN 04/22/2012 14:25      Pt reports wanting detox from alcohol- last drink this am. Drinks approx 6 pack beer/[place] iced tea daily. Relapsed approx 1-2 month ago. Denies SI/HI.     Past Medical History  Diagnosis Date  . Hypertension     History reviewed. No pertinent past surgical history.  No family history on file.  History  Substance Use Topics  . Smoking status: Current Everyday Smoker -- 1.0 packs/day    Types: Cigarettes  . Smokeless tobacco: Not on file  . Alcohol Use: Yes     former drinker    Review of Systems  All other systems reviewed and are negative.  except as noted HPI   Allergies  Review of patient's allergies indicates no known allergies.  Home Medications   Current Outpatient Rx  Name Route Sig Dispense Refill  . LOSARTAN POTASSIUM-HCTZ 100-25 MG PO TABS Oral Take 1 tablet by mouth daily.    . MULTI-VITAMIN/MINERALS PO TABS Oral Take 1 tablet by  mouth daily.    . OMEGA-3-ACID ETHYL ESTERS 1 G PO CAPS Oral Take 1 g by mouth daily.    . OXAPROZIN 600 MG PO TABS Oral Take 600 mg by mouth 2 (two) times daily. One twice daily with food for pain and inflammation.      BP 149/79  Pulse 94  Temp 98.7 F (37.1 C) (Oral)  Resp 16  SpO2 95%  Physical Exam  Nursing note and vitals reviewed. Constitutional: He is oriented to person, place, and time. He appears well-developed and well-nourished. No distress.  HENT:  Head: Atraumatic.  Mouth/Throat: Oropharynx is clear and moist.  Eyes: Conjunctivae are normal. Pupils are equal, round, and reactive to light.  Neck: Neck supple.  Cardiovascular: Normal rate, regular rhythm, normal heart sounds and intact distal pulses.  Exam reveals no gallop and no friction rub.   No murmur heard. Pulmonary/Chest: Effort normal. No respiratory distress. He has no wheezes. He has no rales. He exhibits tenderness.    Abdominal: Soft. Bowel sounds are normal. There is no tenderness. There is no rebound and no guarding.  Musculoskeletal: Normal range of motion. He exhibits no edema and no tenderness.  Neurological: He is alert and oriented to person, place, and time.  Skin: Skin is warm and dry.  Psychiatric: He has a normal mood and affect.    ED Course  Procedures (including critical care time)  Labs Reviewed  COMPREHENSIVE  METABOLIC PANEL - Abnormal; Notable for the following:    Sodium 128 (*)     Potassium 3.2 (*)     Chloride 89 (*)     Glucose, Bld 114 (*)     BUN 5 (*)     Alkaline Phosphatase 125 (*)     All other components within normal limits  ETHANOL - Abnormal; Notable for the following:    Alcohol, Ethyl (B) 183 (*)     All other components within normal limits  URINE RAPID DRUG SCREEN (HOSP PERFORMED) - Abnormal; Notable for the following:    Benzodiazepines POSITIVE (*)     All other components within normal limits  CBC   Dg Ribs Unilateral W/chest Right  04/21/2012   *RADIOLOGY REPORT*  Clinical Data: Right-sided chest pain.  RIGHT RIBS AND CHEST - 3+ VIEW  Comparison: Chest x-ray dated 12/17/2009  Findings: Heart size and vascularity are normal. Lungs are clear except for a tiny granuloma at the right base laterally.  There is slight flattening of the diaphragms suggesting emphysema.  There is minimal irregularity of the anterior aspect of the right third rib which could be due to an old injury but this does not have the appearance of an acute rib fracture.  IMPRESSION: No acute abnormalities. Possible emphysema.  Clinically significant discrepancy from primary report, if provided: None  Original Report Authenticated By: Gwynn Burly, M.D.    1. Alcohol abuse   2. Hypertension   3. Cocaine abuse     MDM  Req inpatient admission for detox. Agree. Medically cleared. ACT to see.        Forbes Cellar, MD 04/22/12 1742

## 2012-04-22 NOTE — ED Notes (Signed)
Up to the desk on the floor

## 2012-04-23 ENCOUNTER — Inpatient Hospital Stay (HOSPITAL_COMMUNITY)
Admission: EM | Admit: 2012-04-23 | Discharge: 2012-04-26 | DRG: 751 | Disposition: A | Discharge status: Home / Self Care | Payer: BC Managed Care – PPO | Source: Ambulatory Visit | Attending: Psychiatry | Admitting: Psychiatry

## 2012-04-23 ENCOUNTER — Encounter (HOSPITAL_COMMUNITY): Payer: Self-pay | Admitting: *Deleted

## 2012-04-23 DIAGNOSIS — I10 Essential (primary) hypertension: Secondary | ICD-10-CM | POA: Diagnosis present

## 2012-04-23 DIAGNOSIS — F1020 Alcohol dependence, uncomplicated: Secondary | ICD-10-CM | POA: Diagnosis present

## 2012-04-23 DIAGNOSIS — F10939 Alcohol use, unspecified with withdrawal, unspecified: Principal | ICD-10-CM | POA: Diagnosis not present

## 2012-04-23 DIAGNOSIS — F17200 Nicotine dependence, unspecified, uncomplicated: Secondary | ICD-10-CM | POA: Diagnosis present

## 2012-04-23 DIAGNOSIS — F1410 Cocaine abuse, uncomplicated: Secondary | ICD-10-CM | POA: Diagnosis present

## 2012-04-23 DIAGNOSIS — F10239 Alcohol dependence with withdrawal, unspecified: Principal | ICD-10-CM | POA: Diagnosis not present

## 2012-04-23 DIAGNOSIS — Z79899 Other long term (current) drug therapy: Secondary | ICD-10-CM

## 2012-04-23 HISTORY — DX: Alcohol dependence, uncomplicated: F10.20

## 2012-04-23 MED ORDER — OXAPROZIN 600 MG PO TABS
600.0000 mg | ORAL_TABLET | Freq: Two times a day (BID) | ORAL | Status: DC
  Administered 2012-04-23 – 2012-04-26 (×6): 600 mg via ORAL
  Filled 2012-04-23 (×10): qty 1

## 2012-04-23 MED ORDER — MAGNESIUM HYDROXIDE 400 MG/5ML PO SUSP: 30.0000 mL | Freq: Every day | ORAL | Status: DC | PRN

## 2012-04-23 MED ORDER — ACETAMINOPHEN 325 MG PO TABS
650.0000 mg | ORAL_TABLET | Freq: Four times a day (QID) | ORAL | Status: DC | PRN
  Administered 2012-04-24: 650 mg via ORAL

## 2012-04-23 MED ORDER — HYDROCHLOROTHIAZIDE 25 MG PO TABS
25.0000 mg | ORAL_TABLET | Freq: Every day | ORAL | Status: DC
  Administered 2012-04-23 – 2012-04-26 (×4): 25 mg via ORAL
  Filled 2012-04-23 (×2): qty 14
  Filled 2012-04-23 (×5): qty 1

## 2012-04-23 MED ORDER — THIAMINE HCL 100 MG/ML IJ SOLN: 100.0000 mg | Freq: Once | INTRAMUSCULAR | Status: DC

## 2012-04-23 MED ORDER — ONDANSETRON 4 MG PO TBDP: 4.0000 mg | ORAL_TABLET | Freq: Four times a day (QID) | ORAL | Status: AC | PRN

## 2012-04-23 MED ORDER — ADULT MULTIVITAMIN W/MINERALS CH
1.0000 | ORAL_TABLET | Freq: Every day | ORAL | Status: DC
  Administered 2012-04-23 – 2012-04-26 (×4): 1 via ORAL
  Filled 2012-04-23 (×6): qty 1

## 2012-04-23 MED ORDER — CHLORDIAZEPOXIDE HCL 25 MG PO CAPS: 25.0000 mg | ORAL_CAPSULE | Freq: Four times a day (QID) | ORAL | Status: AC | PRN

## 2012-04-23 MED ORDER — ADULT MULTIVITAMIN W/MINERALS CH: 1.0000 | ORAL_TABLET | Freq: Every day | ORAL | Status: DC

## 2012-04-23 MED ORDER — CHLORDIAZEPOXIDE HCL 25 MG PO CAPS
25.0000 mg | ORAL_CAPSULE | Freq: Four times a day (QID) | ORAL | Status: AC
  Administered 2012-04-23 (×3): 25 mg via ORAL
  Filled 2012-04-23 (×3): qty 1

## 2012-04-23 MED ORDER — NICOTINE 21 MG/24HR TD PT24
21.0000 mg | MEDICATED_PATCH | Freq: Every day | TRANSDERMAL | Status: DC
  Administered 2012-04-23 – 2012-04-26 (×4): 21 mg via TRANSDERMAL
  Filled 2012-04-23 (×6): qty 1

## 2012-04-23 MED ORDER — VITAMIN B-1 100 MG PO TABS
100.0000 mg | ORAL_TABLET | Freq: Every day | ORAL | Status: DC
  Administered 2012-04-24 – 2012-04-26 (×3): 100 mg via ORAL
  Filled 2012-04-23 (×5): qty 1

## 2012-04-23 MED ORDER — ALUM & MAG HYDROXIDE-SIMETH 200-200-20 MG/5ML PO SUSP
30.0000 mL | ORAL | Status: DC | PRN
  Administered 2012-04-26: 30 mL via ORAL

## 2012-04-23 MED ORDER — CHLORDIAZEPOXIDE HCL 25 MG PO CAPS
25.0000 mg | ORAL_CAPSULE | Freq: Three times a day (TID) | ORAL | Status: AC
  Administered 2012-04-24 (×3): 25 mg via ORAL
  Filled 2012-04-23 (×3): qty 1

## 2012-04-23 MED ORDER — LOPERAMIDE HCL 2 MG PO CAPS: 2.0000 mg | ORAL_CAPSULE | ORAL | Status: AC | PRN

## 2012-04-23 MED ORDER — HYDROXYZINE HCL 25 MG PO TABS
25.0000 mg | ORAL_TABLET | Freq: Four times a day (QID) | ORAL | Status: AC | PRN
  Administered 2012-04-23 – 2012-04-25 (×3): 25 mg via ORAL

## 2012-04-23 MED ORDER — VITAMIN B-1 100 MG PO TABS: 100.0000 mg | ORAL_TABLET | Freq: Every day | ORAL | Status: DC

## 2012-04-23 MED ORDER — POTASSIUM CHLORIDE CRYS ER 20 MEQ PO TBCR
20.0000 meq | EXTENDED_RELEASE_TABLET | Freq: Two times a day (BID) | ORAL | Status: AC
  Administered 2012-04-23 – 2012-04-26 (×6): 20 meq via ORAL
  Filled 2012-04-23 (×6): qty 1

## 2012-04-23 MED ORDER — CHLORDIAZEPOXIDE HCL 25 MG PO CAPS
25.0000 mg | ORAL_CAPSULE | Freq: Every day | ORAL | Status: AC
  Administered 2012-04-26: 25 mg via ORAL
  Filled 2012-04-23 (×3): qty 1

## 2012-04-23 MED ORDER — CHLORDIAZEPOXIDE HCL 25 MG PO CAPS
25.0000 mg | ORAL_CAPSULE | ORAL | Status: AC
  Administered 2012-04-25 (×2): 25 mg via ORAL
  Filled 2012-04-23: qty 1

## 2012-04-23 MED ORDER — LOSARTAN POTASSIUM-HCTZ 100-25 MG PO TABS: 1.0000 | ORAL_TABLET | Freq: Every day | ORAL | Status: DC

## 2012-04-23 MED ORDER — OMEGA-3-ACID ETHYL ESTERS 1 G PO CAPS
1.0000 g | ORAL_CAPSULE | Freq: Every day | ORAL | Status: DC
  Administered 2012-04-23 – 2012-04-26 (×4): 1 g via ORAL
  Filled 2012-04-23 (×6): qty 1

## 2012-04-23 MED ORDER — LOSARTAN POTASSIUM 50 MG PO TABS
100.0000 mg | ORAL_TABLET | Freq: Every day | ORAL | Status: DC
  Administered 2012-04-23 – 2012-04-26 (×4): 100 mg via ORAL
  Filled 2012-04-23 (×4): qty 2
  Filled 2012-04-23 (×2): qty 1
  Filled 2012-04-23 (×2): qty 2

## 2012-04-23 NOTE — Treatment Plan (Signed)
Interdisciplinary Treatment Plan Update (Adult)  Date: 04/23/2012  Time Reviewed: 12:02 PM   Progress in Treatment: Attending groups: Yes Participating in groups: Yes Taking medication as prescribed: Yes Tolerating medication: Yes   Family/Significant other contact made:  Counselor to contact Patient understands diagnosis:  Yes  As evidenced by asking for help with detox from alcohol Discussing patient identified problems/goals with staff:  Yes  See below Medical problems stabilized or resolved:  Yes Denies suicidal/homicidal ideation: Yes  In tx team Issues/concerns per patient self-inventory:  None noted Other:  New problem(s) identified: N/A  Reason for Continuation of Hospitalization: Medication stabilization Withdrawal symptoms  Interventions implemented related to continuation of hospitalization:  Librium taper  Encourage group attendance and participation  Additional comments:  Estimated length of stay: 3-4 days  Discharge Plan: return home and follow up with AA mtgs  New goal(s): N/A  Review of initial/current patient goals per problem list:   1.  Goal(s):  Eliminate SI  Met:  Yes  Target date:7/17  As evidenced [id number] denies SI today  2.  Goal (s): Safely detox from alcohol  Met:  No  Target date:7/20  As evidenced [id number] vitals, no withdrawal symptoms  3.  Goal(s): Identify comprehensive sobriety plan  Met:  Yes  Target date:7/17  As evidenced JY:[id number] to return home and reinvest himself in AA and get a sponser  4.  Goal(s):  Met:  Yes  Target date:  As evidenced by:  Attendees: Patient:  Philip [name] 04/23/2012 12:02 PM  Family:     Physician:  Lupe Carney 04/23/2012 12:02 PM   Nursing: Robbie Louis  04/23/2012 12:02 PM   Case Manager:  Richelle Ito, LCSW 04/23/2012 12:02 PM   Counselor:  Ronda Fairly, LCSWA 04/23/2012 12:02 PM   Other:     Other:     Other:     Other:      Scribe for Treatment Team:   Ida Rogue, 04/23/2012 12:02 PM

## 2012-04-23 NOTE — Progress Notes (Signed)
[facility] Group Notes:  (Counselor/Nursing/MHT/Case Management/Adjunct)  04/23/2012 3:21 PM  Type of Therapy:  Group Therapy  Participation Level:  Active  Participation Quality:  Appropriate, Attentive and Sharing  Affect:  Appropriate  Cognitive:  Appropriate  Insight:  Good  Engagement in Group:  Good  Engagement in Therapy:  Good  Modes of Intervention:  Clarification, Socialization and Support  Summary of Progress/Problems:The focus of this group session was to process how we deal with difficult emotions and share with others the patterns that play out when we are reacting to the emotion verses the situation.  Philip [name] shared that one of the most difficult emotions he deals with is irratibility. He also shared how "previous experience with meditation was helpful.  I don't know why I quit doing this." [name] clearly identified with others in group and choose to participate.      [name] 04/23/2012, 3:21 PM

## 2012-04-23 NOTE — [facility] Counselor (Signed)
Adult Comprehensive Assessment  Patient ID: Philip [name], male   DOB: March 14, 1951, 61 y.o.   MRN: [id number]  Information Source: Information source: Patient  Current Stressors:  Educational / Learning stressors: NA Employment / Job issues: Doesn't truly get along well with everyone but plans to work next year and a half until retirement Family Relationships: Some strain in Pensions consultant / Lack of resources (include bankruptcy): NA Housing / Lack of housing: NA Physical health (include injuries & life threatening diseases): HTN Social relationships: Most friends are either using or clean Substance abuse: 7 month relapse after 6 years clean Bereavement / Loss: Brother 2 years ago  Living/Environment/Situation:  Living Arrangements: Spouse/significant other Living conditions (as described by patient or guardian): Good How long has patient lived in current situation?: 22 years What is atmosphere in current home: Comfortable;Supportive  Family History:  Marital status: Married Number of Years Married: 27  What types of issues is patient dealing with in the relationship?: Wife's concern over patient's drinking Additional relationship information: NA Does patient have children?: No  Childhood History:  By whom was/is the patient raised?: Both parents Additional childhood history information: Father was alcoholic Description of patient's relationship with caregiver when they were a child: Good w Mother; difficult w Father Patient's description of current relationship with people who raised him/her: Get along with mother okay; father deceased Does patient have siblings?: Yes Number of Siblings: 8  Description of patient's current relationship with siblings: 7 are deceased; good relationship with surviving brother; both will now be in recovery Did patient suffer any verbal/emotional/physical/sexual abuse as a child?: No Did patient suffer from severe childhood neglect?: No Has  patient ever been sexually abused/assaulted/raped as an adolescent or adult?: No Was the patient ever a victim of a crime or a disaster?: No Witnessed domestic violence?: No Has patient been effected by domestic violence as an adult?: No  Education:  Highest grade of school patient has completed: 11th Currently a student?: No Name of school: Unable to work due to alcohol use Learning disability?: No  Employment/Work Situation:   Employment situation: Employed 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 How long has patient been employed?: 33 years Patient's job has been impacted by current illness: Yes Describe how patient's job has been impacted: Unable to make it in to work due to current drinking patterns What is the longest time patient has a held a job?: 33 years 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  Last weekend patient used Cocaine on Fri & Saturday evenings. If attempted suicide, did drugs/alcohol play a role in this?:  (No attempt or ideation) Alcohol/Substance Abuse Treatment Hx: Past Tx, Inpatient;Past detox;Attends [facility] If yes, describe treatment: [facility] [place] [facility]; [facility] and [facility] (2006) Has alcohol/substance abuse ever caused legal problems?: No  Social Support System:   Patient's Community Support System: Good Describe Community Support System: Wife, sober brother, Mom and AA friends Type of faith/religion: NA How does patient's faith help to cope with  current illness?: NA  Leisure/Recreation:   Leisure and Hobbies: NA; purchased golf clubs 1.5 years ago, have yet to use  Strengths/Needs:   What things does the patient do well?: Yard work, patient is building up Pensions consultant and machines, 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 In what areas does patient struggle / problems for patient: Finances, alcohol  Discharge Plan:   Does patient have access to transportation?: Yes Will patient be returning to same living situation after discharge?: Yes Currently receiving community mental health services: No If no, would patient like referral for services when discharged?: Yes (What county?) Medical sales [place]) Does patient have financial barriers related to discharge medications?: No  Summary/Recommendations:   Summary and Recommendations (to be completed by the evaluator): Patient is 61 year old married employed African American male admitted with diagnosis of Alcohol Dependence and Cocaine Abuse. Patient reports relapse of 7 months after 6 years of sobriety has been difficult Patient will benefit from crisis stabilization, medication evaluation, group therapy and psycho education in addition to case management for discharge planning  Dyane Dustman, Julious Payer. 04/23/2012

## 2012-04-23 NOTE — [facility] Counselor (Signed)
Pt accepted to [facility] for treatment.  Attending will be Dr. Catha Brow, [phone]

## 2012-04-23 NOTE — Discharge Planning (Signed)
New patient attended AM group, good participation.  Has worked for same company for 33 years.  Been in detox in past.  AA helped him stay sober for 6 years.  Plans to return home, follow up AA mtgs.

## 2012-04-23 NOTE — [facility] Suicide Risk Assessment (Signed)
Suicide Risk Assessment  Admission Assessment     Demographic factors:  See chart.  Current Mental Status:  Patient seen and evaluated. Chart reviewed. Patient stated that his mood was "pretty good". His affect was mood congruent and euthymic. He denied any current thoughts of self injurious behavior, suicidal ideation or homicidal ideation. He denied any significant depressive signs or symptoms at this time. There were no auditory or visual hallucinations, paranoia, delusional thought processes, or mania noted.  Thought process was linear and goal directed.  No psychomotor agitation or retardation was noted. His speech was normal rate, tone and volume. Eye contact was good. Judgment and insight are fair.  Patient has been up and engaged on the unit.  No acute safety concerns reported from team.  Loss Factors: s/p relapse; has had 6 yrs clean in past   Historical Factors:  Involved in AA when working Program;  Married; employed  Risk Reduction Factors: Sense of responsibility to [employer];Living with another person, especially a relative;Positive social support;Positive therapeutic relationship  CLINICAL FACTORS: Alcohol Dependence & W/D; Cocaine Abuse; HTN; Hx reported w/d seizures  COGNITIVE FEATURES THAT CONTRIBUTE TO RISK: none.  SUICIDE RISK: Pt viewed as a chronic increased risk of harm to self in light of his past hx and risk factors.  No acute safety concerns on the unit.  Pt contracting for safety and in need of crisis stabilization, detox off alcohol & Tx.  PLAN OF CARE: Pt admitted for crisis stabilization,detox and treatment.  Please see orders, being edited by Armandina Stammer, NP at this time.   Medications reviewed with pt and medication education provided.  Will continue q15 minute checks per unit protocol.  No clinical indication for one on one level of observation at this time.  Pt contracting for safety.  Mental health treatment, medication management and continued sobriety  will mitigate against the increased risk of harm to self and/or others.  Discussed the importance of recovery with pt, as well as, tools to move forward in a healthy & safe manner.  Pt agreeable with the plan.  Discussed with the team.   Lupe Carney 04/23/2012, 11:49 AM

## 2012-04-23 NOTE — Progress Notes (Signed)
Psychoeducational Group Note  Date:  04/23/2012 Time:  1100  Group Topic/Focus:  Coping With Mental Health Crisis:   The purpose of this group is to help patients identify strategies for coping with mental health crisis.  Group discusses possible causes of crisis and ways to manage them effectively.  Participation Level:  Minimal  Participation Quality:  Appropriate and Sharing  Affect:  Appropriate  Cognitive:  Appropriate  Insight:  Good  Engagement in Group:  Good  Additional Comments:  Pt was appropriate but was limited in his participation. Pt shared that his wife and dog was a trigger for him as he went into a crisis. Pt was encouraged by staff to communicate to his wife that she is sometimes a trigger in his life and pt agreed to communicate this with his wife.   Sharyn Lull 04/23/2012, 1:29 PM

## 2012-04-23 NOTE — Tx Team (Signed)
Initial Interdisciplinary Treatment Plan  PATIENT STRENGTHS: (choose at least two) Ability for insight Average or above average intelligence Capable of independent living Communication skills Financial means General fund of knowledge Motivation for treatment/growth Physical Health Special hobby/interest Supportive family/friends Work skills  PATIENT STRESSORS: Substance abuse   PROBLEM LIST: Problem List/Patient Goals Date to be addressed Date deferred Reason deferred Estimated date of resolution  "To stop drinking" 04/23/12     "To me stop smoking cigarettes" 04/23/12           Substance abuse 04/23/12     Increased for suicide 04/23/12                              DISCHARGE CRITERIA:  Ability to meet basic life and health needs Adequate post-discharge living arrangements Improved stabilization in mood, thinking, and/or behavior Medical problems require only outpatient monitoring Motivation to continue treatment in a less acute level of care Need for constant or close observation no longer present Reduction of life-threatening or endangering symptoms to within safe limits Safe-care adequate arrangements made Verbal commitment to aftercare and medication compliance Withdrawal symptoms are absent or subacute and managed without 24-hour nursing intervention  PRELIMINARY DISCHARGE PLAN: Attend 12-step recovery group Outpatient therapy Participate in family therapy Return to previous living arrangement Return to previous work or school arrangements  PATIENT/FAMIILY INVOLVEMENT: This treatment plan has been presented to and reviewed with the patient, Philip [name], and/or family member.  The patient and family have been given the opportunity to ask questions and make suggestions.  [name] 04/23/2012, 5:26 AM

## 2012-04-23 NOTE — H&P (Signed)
Psychiatric Admission Assessment Adult  Patient Identification:  Philip [name]  Date of Evaluation:  04/23/2012  Chief Complaint:  Alcohol Dependence  History of Present Illness: This is a 61 year old African-American male. Admitted from the [facility] ED with complaints of  alcohol abuse with request for detoxification treatment. Patient reports, "I came here because of my alcohol and cocaine use. I have been using alcohol very heavily since December 2012 after being sober x 4 years. 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 I love to drink alcohol. I like the test and how it makes me feel. But I use cocaine while drinking heavily because it helps keep me from passing out from drinking too much. Cocaine keeps me awake. I have to say that drinking alcohol and using cocaine has not affected my life badly except a drunk driving charge that led me to lose my driver's license in 1610. I am trying to quit drinking now because my health is going bad. I got high blood pressure. That is why I decided to seek treatment. I am not depressed. I don't feel depressed and I don't remember ever being depressed. I have had a number of times that I had gone through substance abuse rehabilitation treatment. After each treatment, I will go home and resume my old habit few days later. I was attending AA meetings at a time, but quit going for a while. I will have to start the AA meetings again".    Mood Symptoms:  denies any mood symptoms.  Depression Symptoms:  Denies feeling and or being depressed.  (Hypo) Manic Symptoms:  Denies.  Anxiety Symptoms:  Denies any anxiety symptoms.  Psychotic Symptoms:  Hallucinations: None  PTSD Symptoms: Had a traumatic exposure:  Denies  Past Psychiatric History: Diagnosis: Alcohol abuse/dependency, Cocaine abuse  Hospitalizations: [facility] x 3  Outpatient Care: "None"  Substance Abuse Care: "I have been to [facility] [place] and [place]  [facility]".  Self-Mutilation: None reported  Suicidal Attempts: Denies any attempts and or thoughts.  Violent Behaviors: None reported.   Past Medical History:   Past Medical History  Diagnosis Date  . Hypertension      Allergies:  No Known Allergies  PTA Medications: Prescriptions prior to admission  Medication Sig Dispense Refill  . losartan-hydrochlorothiazide (HYZAAR) 100-25 MG per tablet Take 1 tablet by mouth daily.      . Multiple Vitamins-Minerals (MULTIVITAMIN WITH MINERALS) tablet Take 1 tablet by mouth daily.      Marland Kitchen omega-3 acid ethyl esters (LOVAZA) 1 G capsule Take 1 g by mouth daily.      Marland Kitchen oxaprozin (DAYPRO) 600 MG tablet Take 600 mg by mouth 2 (two) times daily. One twice daily with food for pain and inflammation.         Substance Abuse History in the last 12 months: Substance Age of 1st Use Last Use Amount Specific Type  Nicotine 21 Prior to hosp 1 & 1/2 Cigarettes  Alcohol 18 Prior to hosp 5th of vodka/gin daily Liquor  Cannabis Denies use     Opiates Denies use     Cocaine 48 Prior to hosp "I use each time I drink" Crack  Methamphetamines Denies use     LSD Denies use     Ecstasy Denies use     Benzodiazepines Denies use     Caffeine      Inhalants      Others:  Consequences of Substance Abuse: Medical Consequences:  Liver damage, tremors, seizures. Legal Consequences:  Arrests, jail time, loss of driving privileges. Family Consequences:  Family discord  Social History: Current Place of Residence:  [place]  Place of Birth: [place]  Family Members: "my wife"  Marital Status:  Married  Children: 0  Sons:0  Daughters:0  Relationships: "I'm married"  Education:  No high school diploma  Educational Problems/Performance: "I did not complete high school"  Religious Beliefs/Practices: None reported  History of Abuse (Emotional/Phsycial/Sexual): None reported  Occupational Experiences: Employed  Hotel manager  History:  None.  Legal History: "I lost my driving privileges 10 year ago".  Hobbies/Interests: "I love to drink"  Family History:  No family history on file.  Mental Status Examination/Evaluation: Objective:  Appearance: Casual  Eye Contact::  Good  Speech:  Clear and Coherent  Volume:  Normal  Mood:  "I'm not depressed, my mood is fine"  Affect:  Appropriate  Thought Process:  Coherent and Intact  Orientation:  Full  Thought Content:  Logical and rational  Suicidal Thoughts:  No  Homicidal Thoughts:  No  Memory:  Immediate;   Good Recent;   Good Remote;   Good  Judgement:  Fair  Insight:  Good  Psychomotor Activity:  Normal  Concentration:  Good  Recall:  Good  Akathisia:  No  Handed:  Right  AIMS (if indicated):     Assets:  Desire for Improvement  Sleep:       Laboratory/X-Ray: None  Psychological Evaluation(s)      Assessment:    AXIS I:  Alcohol Abuse and dependency AXIS II:  Deferred AXIS III:   Past Medical History  Diagnosis Date  . Hypertension    AXIS IV:  other psychosocial or environmental problems AXIS V:  Persistent danger to self due to polysubstance use, abuse and dependency  Treatment Plan/Recommendations: Admit for safety and stabilization. Review and reinstate any pertinent home medications for other health issues. Start Potassium supplement 20 meq bid x 6 doses. Obtain CMP in 3 days.  Treatment Plan Summary: Daily contact with patient to assess and evaluate symptoms and progress in treatment Medication management  Current Medications:  Current Facility-Administered Medications  Medication Dose Route Frequency Provider Last Rate Last Dose  . acetaminophen (TYLENOL) tablet 650 mg  650 mg Oral Q6H PRN Cleotis Nipper, MD      . alum & mag hydroxide-simeth (MAALOX/MYLANTA) 200-200-20 MG/5ML suspension 30 mL  30 mL Oral Q4H PRN Cleotis Nipper, MD      . magnesium hydroxide (MILK OF MAGNESIA) suspension 30 mL  30 mL Oral Daily PRN Cleotis Nipper, MD       Facility-Administered Medications Ordered in Other Encounters  Medication Dose Route Frequency Provider Last Rate Last Dose  . potassium chloride 20 MEQ/15ML (10%) liquid 20 mEq  20 mEq Oral Once Forbes Cellar, MD   20 mEq at 04/22/12 1831  . DISCONTD: alum & mag hydroxide-simeth (MAALOX/MYLANTA) 200-200-20 MG/5ML suspension 30 mL  30 mL Oral PRN Forbes Cellar, MD      . DISCONTD: folic acid (FOLVITE) tablet 1 mg  1 mg Oral Daily Forbes Cellar, MD   1 mg at 04/22/12 1614  . DISCONTD: hydrochlorothiazide (HYDRODIURIL) tablet 25 mg  25 mg Oral Daily Forbes Cellar, MD      . DISCONTD: ibuprofen (ADVIL,MOTRIN) tablet 600 mg  600 mg Oral Q8H PRN Forbes Cellar, MD      . DISCONTD: LORazepam (ATIVAN) injection 1 mg  1 mg Intravenous Q6H PRN Forbes Cellar, MD      . DISCONTD: LORazepam (ATIVAN) tablet 0-4 mg  0-4 mg Oral Q6H Forbes Cellar, MD   1 mg at 04/22/12 2114  . DISCONTD: LORazepam (ATIVAN) tablet 0-4 mg  0-4 mg Oral Q12H Forbes Cellar, MD      . DISCONTD: LORazepam (ATIVAN) tablet 1 mg  1 mg Oral Q8H PRN Forbes Cellar, MD      . DISCONTD: LORazepam (ATIVAN) tablet 1 mg  1 mg Oral Q6H PRN Forbes Cellar, MD      . DISCONTD: losartan (COZAAR) tablet 100 mg  100 mg Oral Daily Forbes Cellar, MD      . DISCONTD: losartan-hydrochlorothiazide (HYZAAR) 100-25 MG per tablet 1 tablet  1 tablet Oral Daily Forbes Cellar, MD      . DISCONTD: multivitamin with minerals tablet 1 tablet  1 tablet Oral Daily Forbes Cellar, MD   1 tablet at 04/22/12 1614  . DISCONTD: nicotine (NICODERM CQ - dosed in mg/24 hours) patch 21 mg  21 mg Transdermal Daily Forbes Cellar, MD   21 mg at 04/22/12 1613  . DISCONTD: ondansetron (ZOFRAN) tablet 4 mg  4 mg Oral Q8H PRN Forbes Cellar, MD      . DISCONTD: thiamine (B-1) injection 100 mg  100 mg Intravenous Daily Forbes Cellar, MD      . DISCONTD: thiamine (VITAMIN B-1) tablet 100 mg  100 mg Oral Daily Forbes Cellar, MD   100 mg at 04/22/12 1614  .  DISCONTD: zolpidem (AMBIEN) tablet 5 mg  5 mg Oral QHS PRN Forbes Cellar, MD   5 mg at 04/22/12 2114    Observation Level/Precautions:  Q 15 minute checks for safety  Laboratory:  Per Ed lab findings: Low Potassiium of 3.2, ETOH levels 183  Psychotherapy:  Group sessions, AA/NA meetings.  Medications: See medication lists   Routine PRN Medications:  Yes  Consultations:  None indicated at this time.  Discharge Concerns:  Maintaining sobriety  Other:     Sanjuana Kava 7/17/201311:47 AM

## 2012-04-23 NOTE — Progress Notes (Signed)
Pt has been up for groups today and has been interacting appropriately with peers and staff.  He denied any depression hopelessness or anxiety on his self-inventory.  He denied any S/H ideation or A/V hallucinations.  He was started on the librium protocol after lunch today.  He denies any symptoms of withdrawal thus far and CIWA has been 0.  He did c/o right rib cage pain 3 out of 10 but refused to take any tylenol for his pain.  He stated,"I had it looked at before I came here and I was told it was all clear"  He plans to go back to [employer]  He does have a job and was able to speak with his supervisor and feels he still has a job.  He is hoping to be discharged by Saturday.

## 2012-04-23 NOTE — Progress Notes (Signed)
Patient ID: Philip [name], male   DOB: January 20, 1951, 61 y.o.   MRN: [id number]  Pt was flat, but pleasant and cooperative during the adm process. Stated that his longest period of sobriety was 3 yrs, but that it happened twice. Pt stated he was last here in 2006 after which he was sober for 3 yrs. After a brief stint of use he was sober for another 3 yrs. Pt stated he started drinking again in 12/12. Writer asked what circumstances made pt come to [facility]. Stated, "I didn't wanna go to work. Drinking 24/7 at the house, nonstop. Body was getting sicker and sicker". Pt c/o right rib pain, but doesn't know what may have caused it. "I'm not sure if I fell or not when I was drinking, or just hit it on something".  Stated he went to urgent care on Monday and was given "something for pain". Writer asked pt about his cocaine use. Pt stated that he only does cocaine if he's drinking. Stated, when he uses cocaine, he doesn't have to drink as much.

## 2012-04-23 NOTE — Progress Notes (Signed)
D: Pt denies SI/HI/AVH. Pt rates his depression, hopelessness, and anxiety as 0. Pt denies any detox symptoms. Pt was complaining of pain 3/10 from bruised R ribs. He states that they only hurt with movement. Pt states that he is unsure about how bruising occurred. A: Support and encouragement offered to pt. Advised pt that he could get pain medication to help with pain relief. Q15 min checks continued for safety. R: Pt receptive, however pt denied any medication at this time. Pt remained safe on unit.

## 2012-04-24 MED ORDER — DULOXETINE HCL 20 MG PO CPEP
20.0000 mg | ORAL_CAPSULE | Freq: Every day | ORAL | Status: DC
  Administered 2012-04-24 – 2012-04-26 (×3): 20 mg via ORAL
  Filled 2012-04-24: qty 14
  Filled 2012-04-24 (×6): qty 1
  Filled 2012-04-24: qty 14

## 2012-04-24 MED ORDER — IBUPROFEN 800 MG PO TABS
800.0000 mg | ORAL_TABLET | Freq: Three times a day (TID) | ORAL | Status: DC | PRN
  Administered 2012-04-24: 800 mg via ORAL
  Filled 2012-04-24: qty 1

## 2012-04-24 NOTE — H&P (Signed)
Pt seen and evaluated upon admission.  Completed Admission Suicide Risk Assessment.  See orders.  Pt agreeable with plan.  Discussed with team.   

## 2012-04-24 NOTE — Progress Notes (Signed)
Psychoeducational Group Note  Date:  04/24/2012 Time:  1100  Group Topic/Focus:  Building Self Esteem:   The Focus of this group is helping patients become aware of the effects of self-esteem on their lives, the things they and others do that enhance or undermine their self-esteem, seeing the relationship between their level of self-esteem and the choices they make and learning ways to enhance self-esteem.  Participation Level:  Active  Participation Quality:  Appropriate, Attentive and Sharing  Affect:  Appropriate  Cognitive:  Alert and Appropriate  Insight:  Good  Engagement in Group:  Good  Additional Comments:  Pt was active and appropriate while attending group. Pt shared that he felt that one good thing about himself was that he was confident.   Sharyn Lull 04/24/2012, 1:59 PM

## 2012-04-24 NOTE — Progress Notes (Signed)
[facility] Group Notes:  (Counselor/Nursing/MHT/Case Management/Adjunct)  04/24/2012 9:05 PM  Type of Therapy:  Group Therapy 1:15 to 2:30  Participation Level:  Active  Participation Quality:  Intrusive  Affect:  Appropriate  Cognitive:  Oriented  Insight:  Limited  Engagement in Group:  Good  Engagement in Therapy:  Limited  Modes of Intervention:  Activity, Limit-setting and Support  Summary of Progress/Problems: Focus of group processing discussion was on balance in life; the components in life which have a negative influence on balance and the components which make for a more balanced life. Philip [name] choose not to participate in activity but used photos in group room to describe balance and imbalance.  For balance he described tree on small rise as comforting as it reminded him of his days on the golf course getting away from all the stressors for 4 hours. He described another picture in the group room as depicting out of balance; "all those clouds represent possible storms and I just want to cling to the tree, alcohol, for safety in order to block out the possibilities."   Philip [name] 04/24/2012, 9:11 PM

## 2012-04-24 NOTE — Progress Notes (Signed)
D: Pt denies SI/HI/AVH. Pt rates his depression, hopelessness, and anxiety as 0. Pt's only complaint has been his rib pain. Pt mood and affect appropriate. Pt resting throughout the day in his room. Pt has been visible on unit and did attend groups. A: Support and encouragement offered. Pt medicated for rib pain. Q15 min checks continued for safety. R: Pt receptive. Pt did report a decrease in his pain. Pt remained safe on the unit.

## 2012-04-24 NOTE — Progress Notes (Signed)
[facility] MD Progress Note  04/24/2012 3:45 PM  S/O: Patient seen and evaluated. Chart reviewed. Patient stated that his mood was "ok".  C/o pain, 7/10. His affect was mood congruent and anxious. He denied any current thoughts of self injurious behavior, suicidal ideation or homicidal ideation. Mild depressive signs or symptoms at this time. There were no auditory or visual hallucinations, paranoia, delusional thought processes, or mania noted. Thought process was linear and goal directed. No psychomotor agitation or retardation was noted. His speech was normal rate, tone and volume. Eye contact was good. Judgment and insight are fair. Patient has been up and engaged on the unit. No acute safety concerns reported from team.   Sleep:  Number of Hours: 6.25    Vital Signs:Blood pressure 120/83, pulse 62, temperature 97.6 F (36.4 C), temperature source Oral, resp. rate 16, height 5' 4.75" (1.645 m), weight 63.73 kg (140 lb 8 oz).  Current Medications:    . chlordiazePOXIDE  25 mg Oral QID   Followed by  . chlordiazePOXIDE  25 mg Oral TID   Followed by  . chlordiazePOXIDE  25 mg Oral BH-qamhs   Followed by  . chlordiazePOXIDE  25 mg Oral Daily  . DULoxetine  20 mg Oral Daily  . hydrochlorothiazide  25 mg Oral Daily  . losartan  100 mg Oral Daily  . multivitamin with minerals  1 tablet Oral Daily  . nicotine  21 mg Transdermal Daily  . omega-3 acid ethyl esters  1 g Oral Daily  . oxaprozin  600 mg Oral BID  . potassium chloride  20 mEq Oral BID  . thiamine  100 mg Intramuscular Once  . thiamine  100 mg Oral Daily    Lab Results: No results found for this or any previous visit (from the past 48 hour(s)).  A/P: Alcohol Dependence & W/D; Cocaine Abuse; HTN; Hx reported w/d seizures   Pt seen and evaluated.  Reviewed short term and long term goals, medications, current treatment in the hospital and acute/chronic safety.  Pt agreeable with treatment plan, see orders. Continue current medications  as noted above.  Initiated Ibuprofen for pain and Cymbalta for depressive s/s and pain. Reviewed CXR with pt.  Repeat CMP for tomorrow am, follow up pending.  Medication education completed.  Pros, cons, risks, potential side effects and benefits were discussed with pt.    Lupe Carney 04/24/2012, 3:45 PM

## 2012-04-24 NOTE — Discharge Planning (Signed)
10:04 AM 04/24/2012  SW met with pt. in discharge planning group.  Pt. Stated he is having difficulty sleeping.  Pt. Stated he would like to go home with his brother upon discharge and work for the next year and a half until retirement.    Clarice Pole, LCASA 04/24/2012, 10:08 AM

## 2012-04-24 NOTE — Progress Notes (Signed)
[facility] Adult Inpatient Family/Significant Other Collateral Contact  Collateral Contact:  Education Completed; Claudia Alvizo at [phone] has been identified by the patient as the family member/significant other who can provide for collateral information. With written consent from the patient, the family member/significant other has been contacted and reports:   That she has no collateral information to offer, no additional concerns other than his current alcohol use.   Ms Carmicheal was supportive of patient discharging over the weekend  Writer provided phone number and   [facility] Crisis Unit telephone number   [name] 04/24/2012 4:52 PM

## 2012-04-24 NOTE — Progress Notes (Deleted)
[facility] Adult Inpatient Family/Significant Other Suicide Prevention Education  Suicide Prevention Education:  Education Completed; Philip [name] (ex-boyfriend, father of patient's child) at [phone] has been identified by the patient as the family member/significant other with whom the patient will be residing, and identified as the person(s) who will aid the patient in the event of a mental health crisis (suicidal ideations/suicide attempt). With written consent from the patient, the family member/significant other has been provided the following suicide prevention education, prior to the and/or following the discharge of the patient.  The suicide prevention education provided includes the following:  Suicide risk factors  Suicide prevention and interventions  National Suicide Hotline telephone number  [facility] assessment telephone number  [place] Emergency Assistance 911  County and/or Residential Mobile Crisis Unit telephone number Request made of family/significant other to:  Remove weapons (e.g., guns, rifles, knives), all items previously/currently identified as safety concern.  Remove drugs/medications (over-the-counter, prescriptions, illicit drugs), all items previously/currently identified as a safety concern.  Mr [name] reports there are no firearms or narcotic medications in the home.  The family member/significant other verbalizes understanding of the suicide prevention education information provided. The family member/significant other agrees to remove the items of safety concern listed above.  [name], Philip [name]  04/24/2012, 5:02 PM     

## 2012-04-24 NOTE — Progress Notes (Signed)
D   Pt in bed resting with eyes closed  Respirations are normal   No complaints voiced  Pt does not appear to be in distress A   Q 15 min checks  Will continue to monitor R   Pt safe at present time

## 2012-04-24 NOTE — Progress Notes (Signed)
Psychoeducational Group Note  Date:  04/23/2012 Time:  2000  Group Topic/Focus:  AA  Participation Level:  Active  Participation Quality:  Appropriate  Affect:  Appropriate  Cognitive:  Alert  Insight:  Good  Engagement in Group:  Good  Additional Comments:  Pt attended and participated in AA this evening.  Kaleen Odea R 04/24/2012, 12:14 AM

## 2012-04-25 DIAGNOSIS — F1020 Alcohol dependence, uncomplicated: Secondary | ICD-10-CM

## 2012-04-25 LAB — COMPREHENSIVE METABOLIC PANEL
AST: 18 U/L (ref 0–37)
Albumin: 3.3 g/dL — ABNORMAL LOW (ref 3.5–5.2)
Calcium: 9.3 mg/dL (ref 8.4–10.5)
Creatinine, Ser: 0.59 mg/dL (ref 0.50–1.35)
GFR calc non Af Amer: >90 mL/min (ref >90)
Total Protein: 6.9 g/dL (ref 6.0–8.3)

## 2012-04-25 NOTE — Progress Notes (Signed)
D: Pt in bed resting with eyes closed. Respirations even and unlabored. Pt appears to be in no signs of distress at this time. A: Q15min checks remains for this pt. R: Pt remains safe at this time.   

## 2012-04-25 NOTE — Progress Notes (Signed)
[facility] MD Progress Note  04/25/2012 2:16 PM  S: "I feel sleepy, but I'm all right. I'm counting down because I will be going tomorrow. I might have to go to some treatment center or something. I believe I could still go to [employer] and I be all right. I am still not depressed. I can't remember feeling depressed".  Diagnosis:   Axis I: Alcohol dependence Axis II: Deferred Axis III:  Past Medical History  Diagnosis Date  . Hypertension    Axis IV: other psychosocial or environmental problems Axis V: 61-70 mild symptoms  ADL's:  Intact  Sleep: Good  Appetite:  Good  Suicidal Ideation:  Plan:  No Intent:  No Means:  No Homicidal Ideation:  Plan:  No Intent:  No Means:  no  AEB (as evidenced by): Per patient's reports  Mental Status Examination/Evaluation: Objective:  Appearance: Casual  Eye Contact::  Good  Speech:  Clear and Coherent  Volume:  Normal  Mood:  Euthymic  Affect:  Appropriate  Thought Process:  Coherent and Intact  Orientation:  Full  Thought Content:  Rumination  Suicidal Thoughts:  No  Homicidal Thoughts:  No  Memory:  Immediate;   Good Recent;   Good Remote;   Good  Judgement:  Fair  Insight:  Fair  Psychomotor Activity:  Normal  Concentration:  Good  Recall:  Good  Akathisia:  No  Handed:  Right  AIMS (if indicated):     Assets:  Desire for Improvement  Sleep:  Number of Hours: 6.25    Vital Signs:Blood pressure 94/61, pulse 102, temperature 98.1 F (36.7 C), temperature source Oral, resp. rate 18, height 5' 4.75" (1.645 m), weight 63.73 kg (140 lb 8 oz). Current Medications: Current Facility-Administered Medications  Medication Dose Route Frequency Provider Last Rate Last Dose  . alum & mag hydroxide-simeth (MAALOX/MYLANTA) 200-200-20 MG/5ML suspension 30 mL  30 mL Oral Q4H PRN Cleotis Nipper, MD      . chlordiazePOXIDE (LIBRIUM) capsule 25 mg  25 mg Oral Q6H PRN Sanjuana Kava, NP      . chlordiazePOXIDE (LIBRIUM) capsule 25 mg  25 mg  Oral TID Alyson Kuroski-Mazzei, DO   25 mg at 04/24/12 1618   Followed by  . chlordiazePOXIDE (LIBRIUM) capsule 25 mg  25 mg Oral BH-qamhs Alyson Kuroski-Mazzei, DO   25 mg at 04/25/12 0846   Followed by  . chlordiazePOXIDE (LIBRIUM) capsule 25 mg  25 mg Oral Daily Alyson Kuroski-Mazzei, DO      . DULoxetine (CYMBALTA) DR capsule 20 mg  20 mg Oral Daily Alyson Kuroski-Mazzei, DO   20 mg at 04/25/12 0845  . hydrochlorothiazide (HYDRODIURIL) tablet 25 mg  25 mg Oral Daily Alyson Kuroski-Mazzei, DO   25 mg at 04/25/12 0847  . hydrOXYzine (ATARAX/VISTARIL) tablet 25 mg  25 mg Oral Q6H PRN Sanjuana Kava, NP   25 mg at 04/24/12 2154  . ibuprofen (ADVIL,MOTRIN) tablet 800 mg  800 mg Oral Q8H PRN Alyson Kuroski-Mazzei, DO   800 mg at 04/24/12 1612  . loperamide (IMODIUM) capsule 2-4 mg  2-4 mg Oral PRN Sanjuana Kava, NP      . losartan (COZAAR) tablet 100 mg  100 mg Oral Daily Alyson Kuroski-Mazzei, DO   100 mg at 04/25/12 0845  . magnesium hydroxide (MILK OF MAGNESIA) suspension 30 mL  30 mL Oral Daily PRN Cleotis Nipper, MD      . multivitamin with minerals tablet 1 tablet  1 tablet Oral  Daily Alyson Kuroski-Mazzei, DO   1 tablet at 04/25/12 0847  . nicotine (NICODERM CQ - dosed in mg/24 hours) patch 21 mg  21 mg Transdermal Daily Alyson Kuroski-Mazzei, DO   21 mg at 04/25/12 0640  . omega-3 acid ethyl esters (LOVAZA) capsule 1 g  1 g Oral Daily Sanjuana Kava, NP   1 g at 04/25/12 0845  . ondansetron (ZOFRAN-ODT) disintegrating tablet 4 mg  4 mg Oral Q6H PRN Sanjuana Kava, NP      . oxaprozin (DAYPRO) tablet 600 mg  600 mg Oral BID Sanjuana Kava, NP   600 mg at 04/25/12 0845  . potassium chloride SA (K-DUR,KLOR-CON) CR tablet 20 mEq  20 mEq Oral BID Sanjuana Kava, NP   20 mEq at 04/25/12 0845  . thiamine (B-1) injection 100 mg  100 mg Intramuscular Once [employer], DO      . thiamine (VITAMIN B-1) tablet 100 mg  100 mg Oral Daily Alyson Kuroski-Mazzei, DO   100 mg at 04/25/12 0847  .  DISCONTD: acetaminophen (TYLENOL) tablet 650 mg  650 mg Oral Q6H PRN Cleotis Nipper, MD   650 mg at 04/24/12 4098    Lab Results:  Results for orders placed during the hospital encounter of 04/23/12 (from the past 48 hour(s))  COMPREHENSIVE METABOLIC PANEL     Status: Abnormal   Collection Time   04/25/12  6:20 AM      Component Value Range Comment   Sodium 134 (*) 135 - 145 mEq/L    Potassium 4.2  3.5 - 5.1 mEq/L    Chloride 98  96 - 112 mEq/L    CO2 28  19 - 32 mEq/L    Glucose, Bld 92  70 - 99 mg/dL    BUN 13  6 - 23 mg/dL    Creatinine, Ser 1.19  0.50 - 1.35 mg/dL    Calcium 9.3  8.4 - 14.7 mg/dL    Total Protein 6.9  6.0 - 8.3 g/dL    Albumin 3.3 (*) 3.5 - 5.2 g/dL    AST 18  0 - 37 U/L    ALT 20  0 - 53 U/L    Alkaline Phosphatase 113  39 - 117 U/L    Total Bilirubin 0.4  0.3 - 1.2 mg/dL    GFR calc non Af Amer >90  >90 mL/min    GFR calc Af Amer >90  >90 mL/min     Physical Findings: AIMS: Facial and Oral Movements Muscles of Facial Expression: None, normal Lips and Perioral Area: None, normal Jaw: None, normal Tongue: None, normal,Extremity Movements Upper (arms, wrists, hands, fingers): None, normal Lower (legs, knees, ankles, toes): None, normal, Trunk Movements Neck, shoulders, hips: None, normal, Overall Severity Severity of abnormal movements (highest score from questions above): None, normal Incapacitation due to abnormal movements: None, normal Patient's awareness of abnormal movements (rate only patient's report): No Awareness, Dental Status Current problems with teeth and/or dentures?: No Does patient usually wear dentures?: No  CIWA:  CIWA-Ar Total: 0  COWS:     Treatment Plan Summary: Daily contact with patient to assess and evaluate symptoms and progress in treatment Medication management  Plan: Continue current treatment plan.  Armandina Stammer I 04/25/2012, 2:16 PM

## 2012-04-25 NOTE — Progress Notes (Signed)
D:  Pt about on the unit today.  RN had to awaken him for medications.  He said he slept well last pm and that his appetite is good and that his energy level is normal.  He denies any SI/HI, he denies any withdrawal symptoms.  He reports feelings of hopeless as rating 1/10.  He is attending groups on the unit.  A: Rn offered support and encouragement, given meds as prescribed. R:  Receptive to staff.  Remains on q 15 min checks for safety.  Will continue to monitor.

## 2012-04-25 NOTE — Progress Notes (Signed)
[facility] Group Notes:  (Counselor/Nursing/MHT/Case Management/Adjunct)  04/25/2012 1:51 PM  Type of Therapy:  Psychoeducational Skills  Participation Level:  Did Not Attend  Modes of Intervention:  Activity  Summary of Progress/Problems:Pt did not attend [employer].    Dalia Heading 04/25/2012, 1:51 PM

## 2012-04-25 NOTE — Progress Notes (Addendum)
D: Pt presents calm and cooperative this evening. Pt is no longer experiencing any detox symptoms. Pt is denying any depression or anxiety. Pt is also denying any SI/HI. Pt attended group this evening.  A:Pt informed that librium protocol is set to end tomorrow. Pt reports that librium causes him to become drowsy during the day. Pt informed of the librium purpose and his patient rights of refusing a medication. Continued support and availability as needed has been extended to this pt.  R: Pt safety remains with q79min checks.

## 2012-04-25 NOTE — Progress Notes (Signed)
[facility] Case Management Discharge Plan:  Will you be returning to the same living situation after discharge: Yes,  home At discharge, do you have transportation home?:Yes,  brother Do you have the ability to pay for your medications:No.  Interagency Information:     Release of information consent forms completed and in the chart;  Patient's signature needed at discharge.  Patient to Follow up at:  Follow-up Information    Follow up with [facility]. (Walk in the week of August  5th so that you can be seen by the Dr for  renewal of your prescriptions)    Contact information:   [address]  [place]  [phone]         Patient denies SI/HI:   Yes,  yes    Safety Planning and Suicide Prevention discussed:  Yes,  yes  Barrier to discharge identified:No.  Summary and Recommendations:   Philip [name] 04/25/2012, 3:15 PM

## 2012-04-25 NOTE — Progress Notes (Signed)
Psychoeducational Group Note  Date:  04/25/2012 Time:  1100  Group Topic/Focus:  Relapse Prevention Planning:   The focus of this group is to define relapse and discuss the need for planning to combat relapse.  Additional Comments:  Pt did not attend group.   Dalia Heading 04/25/2012, 1:53 PM

## 2012-04-25 NOTE — Discharge Planning (Signed)
Today Carrell is complaining of sedation in AM group.  States his pain is a bit better since the Dr started something for pain yesterday.  Adamant that he will d/c tomorrow and return to work on Monday.  Does not want a letter excusing him for any longer.  Will attend daily AA mtgs at [employer].

## 2012-04-25 NOTE — Progress Notes (Signed)
[facility] Group Notes:  (Counselor/Nursing/MHT/Case Management/Adjunct)  04/25/2012 12:54 PM  Type of Therapy:  Group Therapy at 1:15 to 2:30  Participation Level:  Active  Participation Quality:  Monopolizing  Affect:  Depressed  Cognitive:  Alert and Oriented  Insight:  Limited  Engagement in Group:  Good  Engagement in Therapy:  Limited  Modes of Intervention:  Clarification, Education, Socialization and Support  Summary of Progress/Problems: Group session included an educational portion on Post Acute Withdrawal Syndrome (PAWS) and a processing portion on feelings about relapse and what, if anything, is the motive for recovery. Philip [name] was awakened to come to group and taken out from group twice by medical staff.  Patient's comments as usual are frequent and veer off from topic somewhat yet patient  willingly redirects.  Today Philip [name] was focused on medical identification of disease concept.    [name]   04/25/2012 3:36 PM

## 2012-04-26 DIAGNOSIS — F1010 Alcohol abuse, uncomplicated: Secondary | ICD-10-CM

## 2012-04-26 MED ORDER — NICOTINE 21 MG/24HR TD PT24: 1.0000 | MEDICATED_PATCH | Freq: Every day | TRANSDERMAL | Status: AC

## 2012-04-26 MED ORDER — OXAPROZIN 600 MG PO TABS: 600.0000 mg | ORAL_TABLET | Freq: Two times a day (BID) | ORAL | Status: DC

## 2012-04-26 MED ORDER — DULOXETINE HCL 20 MG PO CPEP: 20.0000 mg | ORAL_CAPSULE | Freq: Every day | ORAL | Status: DC

## 2012-04-26 MED ORDER — HYDROCHLOROTHIAZIDE 25 MG PO TABS: 25.0000 mg | ORAL_TABLET | Freq: Every day | ORAL | Status: DC

## 2012-04-26 MED ORDER — MULTI-VITAMIN/MINERALS PO TABS: 1.0000 | ORAL_TABLET | Freq: Every day | ORAL | Status: DC

## 2012-04-26 MED ORDER — LOSARTAN POTASSIUM-HCTZ 100-25 MG PO TABS: 1.0000 | ORAL_TABLET | Freq: Every day | ORAL | Status: DC

## 2012-04-26 MED ORDER — OMEGA-3-ACID ETHYL ESTERS 1 G PO CAPS: 1.0000 g | ORAL_CAPSULE | Freq: Every day | ORAL | Status: DC

## 2012-04-26 NOTE — [facility] Suicide Risk Assessment (Signed)
Suicide Risk Assessment  Discharge Assessment     Demographic factors:  Male    Current Mental Status Per Nursing Assessment::   On Admission:   (none) At Discharge:   none  Current Mental Status Per Physician:  Loss Factors:  unable to function well  Historical Factors:  no SI attempts  Risk Reduction Factors:    willing to get better and good support  Continued Clinical Symptoms:  Alcohol/Substance Abuse/Dependencies  Discharge Diagnoses:   AXIS I:  Alcohol Abuse AXIS II:  Deferred AXIS III:   Past Medical History  Diagnosis Date  . Hypertension    AXIS IV:  other psychosocial or environmental problems AXIS V:  51-60 moderate symptoms  Cognitive Features That Contribute To Risk:  Closed-mindedness    Suicide Risk:  Minimal: No identifiable suicidal ideation.  Patients presenting with no risk factors but with morbid ruminations; may be classified as minimal risk based on the severity of the depressive symptoms  Plan Of Care/Follow-up recommendations:   Pt. scheduled for discharge today. Pt. Indicated that wife or brother will be picking up. His follow up is to attend AA meeting and has an appointment with primary care physician.      Philip [name] 04/26/2012, 11:42 AM

## 2012-04-26 NOTE — Progress Notes (Signed)
Patient ID: Philip [name], male   DOB: Sep 21, 1951, 61 y.o.   MRN: [id number] Pt. attended and participated in aftercare planning group. Pt. verbally accepted information on suicide prevention, warning signs to look for with suicide and crisis line numbers to use. The pt. agreed to call crisis line numbers if having warning signs or having thoughts of suicide. Pt. listed their current anxiety and depression levels are being low.   Pt. scheduled for discharge today.  Pt. Indicated that wife or brother will be picking up. His follow up is to attend AA meeting and has an appointment with primary care physician.  Pt. inquired about the possibility of receiving a note from doctor stating that he is required to attend AA meetings in order to get off work early to attend the meetings.  Case manger responded that this type of note is not procedure due to the fact that AA meetings are scheduled throughout the day everyday. Case manger suggested that pt. utilize his lunch hr. In order to attend meetings. When asked if pt. Would like to take an AA meeting schedule with him, pt. stated " No, there is an AA group a few miles from his job".

## 2012-04-26 NOTE — Progress Notes (Signed)
D) Pt being discharged to home accompanied by a family member.  Denies SI and HI delusions and hallucinations. A) All Pt.'s belongings returned to him, along with an explaination of his medications and follow up treatment. Given support, reassurance and praise. AVS was given to Pt. Along with his samples of medications. R) Denies SI and HI.

## 2012-04-26 NOTE — Progress Notes (Signed)
Patient ID: Philip [name], male   DOB: 05-Jun-1951, 61 y.o.   MRN: [id number]  [facility] Group Notes:  (Counselor/Nursing/MHT/Case Management/Adjunct)  04/26/2012 1:15 PM  Type of Therapy:  Group Therapy, Dance/Movement Therapy   Participation Level:  Did Not Attend, Pt. in. room.   Rhunette Croft 04/26/2012. 3:26 PM

## 2012-04-28 NOTE — Discharge Summary (Signed)
Physician Discharge Summary Note  Patient:  Philip [name] is an 61 y.o., male MRN:  [id number] DOB:  1951/03/06 Patient phone:  [phone] (home)  Patient address:   [address] [place] [address],   Date of Admission:  04/23/2012  Date of Discharge: 04/26/12  Reason for Admission: Alcohol detoxification  Discharge Diagnoses: Principal Problem:  *Alcohol dependence   Axis Diagnosis:   AXIS I:  Alcohol Abuse and dependence AXIS II:  Deferred AXIS III:   Past Medical History  Diagnosis Date  . Hypertension    AXIS IV:  other psychosocial or environmental problems AXIS V:  67  Level of Care:  OP  Hospital Course:  This is a 61 year old African-American male. Admitted from the [facility] ED with complaints of alcohol abuse with request for detoxification treatment. Patient reports, "I came here because of my alcohol and cocaine use. I have been using alcohol very heavily since December 2012 after being sober x 4 years. 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 I love to drink alcohol. I like the test and how it makes me feel. But I use cocaine while drinking heavily because it helps keep me from passing out from drinking too much. Cocaine keeps me awake. I have to say that drinking alcohol and using cocaine has not affected my life badly except a drunk driving charge that led me to lose my driver's license in 2130. I am trying to quit drinking now because my health is going bad. I got high blood pressure. That is why I decided to seek treatment. I am not depressed. I don't feel depressed and I don't remember ever being depressed. I have had a number of times that I had gone through substance abuse rehabilitation treatment. After each treatment, I will go home and resume my old habit few days later. I was attending AA meetings at a time, but quit going for a while. I will have to start the AA meetings again".   Upon admission in this hospital, Philip [name] was started  on Librium protocol for his alcohol detoxification. He was also enrolled in group counseling sessions and activities to learn coping skills. He also attended AA/NA meetings being offered and held in this unit. He has some previous and or identifiable medical conditions that required treatment or monitoring. He received medication management for all those health issues as well. He was monitored closely for any potential problems that may arise as of and or during detoxification treatment. Patient tolerated his treatment regimen and detoxification treatment without any significant adverse effects and or reactions.  Patient attended treatment team meeting this am and met with the team. His symptoms, substance abuse issues, response to to treatment and discharge plans discussed. Patient endorsed that he is doing well and stable for discharge to pursue the next phase of his substance abuse treatment.  He was encouraged to attend 90 AA meetings in 90 days, get a trusted sponsor from the advise of others or from whomever within the AA meetings seems to make sense, and has a proven track record, and will hold him responsible for his sobriety, and both expects and insists on his total  abstinence from alcohol. Philip [name] was instructed to do the steps HONESTLY with the trusted sponsor.He is encouraged to get obsessed with recovery by often reminding himself of how deadly this dredged horrible disease of addiction just is. He must focus the first of each month on speaker meetings  where he will specifically look at how his life has been wrecked by drugs/alcohol and how his life has been similar to that of the speaker's life.   It was agreed upon between patient and the team that he will be discharged to his home that he shares with his wife.  He will follow-up care at the [facility] on 05/12/12. This is a walk-in appointment and Philip [name] is made aware of this. Upon discharge, patient adamantly denies suicidal,  homicidal ideations, auditory, visual hallucinations, delusional thinking and or withdrawal symptoms. Patient left [facility] with all personal belongings in no apparent distress. He received 2 weeks worth samples of his discharge medications. Transportation per family.    Consults:  None  Significant Diagnostic Studies:  labs: CBC with diff, CMP, Toxicology  Discharge Vitals:   Blood pressure 107/74, pulse 80, temperature 98.1 F (36.7 C), temperature source Oral, resp. rate 16, height 5' 4.75" (1.645 m), weight 63.73 kg (140 lb 8 oz).  Mental Status Exam: See Mental Status Examination and Suicide Risk Assessment completed by Attending Physician prior to discharge.  Discharge destination:  Home  Is patient on multiple antipsychotic therapies at discharge:  No   Has Patient had three or more failed trials of antipsychotic monotherapy by history:  No  Recommended Plan for Multiple Antipsychotic Therapies: NA   Medication List  As of 04/28/2012  9:49 AM   TAKE these medications      Indication    DULoxetine 20 MG capsule   Commonly known as: CYMBALTA   Take 1 capsule (20 mg total) by mouth daily. For depression       hydrochlorothiazide 25 MG tablet   Commonly known as: HYDRODIURIL   Take 1 tablet (25 mg total) by mouth daily. For high blood pressure control       losartan-hydrochlorothiazide 100-25 MG per tablet   Commonly known as: HYZAAR   Take 1 tablet by mouth daily. For blood pressure control       multivitamin with minerals tablet   Take 1 tablet by mouth daily. Multivitamin supplement       nicotine 21 mg/24hr patch   Commonly known as: NICODERM CQ - dosed in mg/24 hours   Place 1 patch onto the skin daily. For Nicotine withdrawal symptoms.       omega-3 acid ethyl esters 1 G capsule   Commonly known as: LOVAZA   Take 1 capsule (1 g total) by mouth daily. For high cholesterol control       oxaprozin 600 MG tablet   Commonly known as: DAYPRO   Take 1 tablet (600 mg  total) by mouth 2 (two) times daily. One twice daily with food: For pain and inflammation.            Follow-up Information    Follow up with [facility]. (Walk in the week of August  5th so that you can be seen by the Dr for  renewal of your prescriptions)    Contact information:   [address]  [place]  [phone]         Follow-up recommendations:  Activity:  as tolerated Other:  Keep all scheduled follow-up appointments as recommended.  Take all your medications as prescribed by your mental healthcare provider. Report any adverse effects and or reactions from your medicines to your outpatient provider promptly. Patient is instructed and cautioned to not engage in alcohol and or illegal drug use while on prescription medicines. In the  event of worsening symptoms, patient is instructed to call the crisis hotline, 911 and or go to the nearest ED for appropriate evaluation and treatment of symptoms. Follow-up with your primary care provider for your other medical issues, concerns and or health care needs.    Comments:    SignedSanjuana Kava 04/28/2012, 9:49 AM

## 2012-04-28 NOTE — Progress Notes (Signed)
Patient Discharge Instructions:  After Visit Summary (AVS):   Faxed to:  04/28/2012 Psychiatric Admission Assessment Note:   Faxed to:  04/28/2012 Suicide Risk Assessment - Discharge Assessment:   Faxed to:  04/28/2012 Faxed/Sent to the Next Level Care provider:  04/28/2012  Faxed to [facility] @ [phone]  Wandra Scot, 04/28/2012, 5:19 PM

## 2012-05-09 ENCOUNTER — Ambulatory Visit (HOSPITAL_COMMUNITY): Payer: Self-pay | Admitting: Psychiatry

## 2012-11-27 ENCOUNTER — Ambulatory Visit (INDEPENDENT_AMBULATORY_CARE_PROVIDER_SITE_OTHER): Payer: BC Managed Care – PPO | Admitting: Family Medicine

## 2012-11-27 VITALS — BP 144/80 | HR 77 | Temp 98.7°F | Resp 18 | Wt 147.0 lb

## 2012-11-27 DIAGNOSIS — N401 Enlarged prostate with lower urinary tract symptoms: Secondary | ICD-10-CM

## 2012-11-27 DIAGNOSIS — E0590 Thyrotoxicosis, unspecified without thyrotoxic crisis or storm: Secondary | ICD-10-CM

## 2012-11-27 DIAGNOSIS — I10 Essential (primary) hypertension: Secondary | ICD-10-CM

## 2012-11-27 DIAGNOSIS — R42 Dizziness and giddiness: Secondary | ICD-10-CM

## 2012-11-27 DIAGNOSIS — R351 Nocturia: Secondary | ICD-10-CM

## 2012-11-27 DIAGNOSIS — R350 Frequency of micturition: Secondary | ICD-10-CM

## 2012-11-27 DIAGNOSIS — R739 Hyperglycemia, unspecified: Secondary | ICD-10-CM

## 2012-11-27 LAB — POCT UA - MICROSCOPIC ONLY
Casts, Ur, LPF, POC: NEGATIVE
Epithelial cells, urine per micros: NEGATIVE
Mucus, UA: NEGATIVE
Yeast, UA: NEGATIVE

## 2012-11-27 LAB — POCT CBC
HCT, POC: 45.3 % (ref 43.5–53.7)
Lymph, poc: 2.2 (ref 0.6–3.4)
MCHC: 32.2 g/dL (ref 31.8–35.4)
POC Granulocyte: 5 (ref 2–6.9)
POC LYMPH PERCENT: 28.5 %L (ref 10–50)
POC MID %: 6 %M (ref 0–12)
RDW, POC: 14.6 %

## 2012-11-27 LAB — COMPREHENSIVE METABOLIC PANEL
AST: 19 U/L (ref 0–37)
Alkaline Phosphatase: 100 U/L (ref 39–117)
BUN: 12 mg/dL (ref 6–23)
Creat: 0.59 mg/dL (ref 0.50–1.35)
Total Bilirubin: 0.3 mg/dL (ref 0.3–1.2)

## 2012-11-27 LAB — POCT URINALYSIS DIPSTICK
Glucose, UA: NEGATIVE
Nitrite, UA: NEGATIVE
Protein, UA: NEGATIVE
Urobilinogen, UA: 0.2
pH, UA: 5

## 2012-11-27 LAB — GLUCOSE, POCT (MANUAL RESULT ENTRY): POC Glucose: 117 mg/dl — AB (ref 70–99)

## 2012-11-27 NOTE — Patient Instructions (Addendum)
I will contact you with the results of the remaining lab tests. Please continue your medications as before.  I encourage you to cut back on your soda drinking, especially those that contain caffeine, which can increase the frequency with which you need to urinate.

## 2012-11-27 NOTE — Progress Notes (Signed)
Subjective:    Patient ID: Philip [name], male    DOB: 1950-12-18, 62 y.o.   MRN: [id number]  HPI This 62 y.o. male presents for evaluation of dizziness. Feels dizzy first thing in the mornings, lasts about 10 minutes, while getting ready for work.  May occur at work, in the mornings, lasting only about 1 minute.  Eats a breakfast in the mornings before work.  Episodes began about 6 months ago.  Notes his BP meds were changed approximately 02/2012 due to uncontrolled BP (Hyzaar dose changed from 100/12.5 to 100/25).  Last EtOH 05/24/2012. Last cocaine 05/24/2012. Denies other illicit drug use.  Sometimes feel unbalanced, "like I'm on one leg, like I'm going to fall."  Long-standing nocturia-6-7 times each night "I can't get no sleep." Also, increased urinary frequency during the day.  LEFT wrist pain due to work activities.   Past Medical History  Diagnosis Date  . Hypertension   . Substance abuse     EtOH, cocaine; last use 05/2012  . Alcohol dependence 04/23/2012    History reviewed. No pertinent past surgical history.  Prior to Admission medications   Medication Sig Start Date End Date Taking? Authorizing Provider  Cyanocobalamin (B-12 PO) Take by mouth. Liquid product-takes it along with the tablet   Yes Historical Provider, MD  cyanocobalamin 1000 MCG tablet Take 100 mcg by mouth daily.   Yes Historical Provider, MD  DULoxetine (CYMBALTA) 20 MG capsule Take 1 capsule (20 mg total) by mouth daily. For depression 04/26/12 04/26/13 Yes Sanjuana Kava, NP  losartan-hydrochlorothiazide (HYZAAR) 100-25 MG per tablet Take 1 tablet by mouth daily. For blood pressure control 04/26/12  Yes Sanjuana Kava, NP  Multiple Vitamins-Minerals (MULTIVITAMIN WITH MINERALS) tablet Take 1 tablet by mouth daily. Multivitamin supplement 04/26/12  Yes Sanjuana Kava, NP    No Known Allergies  History   Social History  . Marital Status: Married    Spouse Name: Doris    Number of Children: 0  . 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 Smoking status: Current Every Day Smoker -- 1.50 packs/day for 25 years    Types: Cigarettes  . Smokeless tobacco: Not on file     Comment: has cut down to 1 ppd  . Alcohol Use: No     Comment: No EtOH since 05/2012  . Drug Use: No     Comment: No illicits since 05/2012  . Sexually Active: Not Currently -- Male partner(s)   Other Topics Concern  . Not on file   Social History Narrative   Lives with his wife and their dog.    Family History  Problem Relation Age of Onset  . Cancer Mother     lung CA  . Hypertension Mother   . Hyperlipidemia Mother      Review of Systems No chest pain, SOB, HA, vision change, N/V, diarrhea, constipation, dysuria, urinary urgency or frequency, myalgias, arthralgias or rash.     Objective:   Physical Exam Blood pressure 144/80, pulse 77, temperature 98.7 F (37.1 C), temperature source Oral, resp. rate 18, weight 147 lb (66.679 kg). Body mass index is 24.64 kg/(m^2). Well-developed, well nourished BM who is awake, alert and oriented, in NAD. HEENT: [place]/AT, PERRL, EOMI.  Sclera and conjunctiva are clear.  EAC are patent, TMs are normal in appearance. Nasal mucosa is pink and moist. OP is clear. Compensated partial  edentula. Neck: supple, non-tender, no lymphadenopathy, thyromegaly. Heart: RRR, no murmur Lungs: normal effort, CTA Abdomen: normo-active bowel sounds, supple, non-tender, no mass or organomegaly. Extremities: no cyanosis, clubbing or edema. Skin: warm and dry without rash. Psychologic: good mood and appropriate affect, normal speech and behavior.   Results for orders placed in visit on 11/27/12  POCT CBC      Result Value Range   WBC 7.7  4.6 - 10.2 K/uL   Lymph, poc 2.2  0.6 - 3.4   POC LYMPH PERCENT 28.5  10 - 50 %L   MID (cbc) 0.5  0 - 0.9   POC MID % 6.0  0 - 12 %M   POC Granulocyte 5.0  2 - 6.9    Granulocyte percent 65.5  37 - 80 %G   RBC 5.39  4.69 - 6.13 M/uL   Hemoglobin 14.6  14.1 - 18.1 g/dL   HCT, POC 16.1  09.6 - 53.7 %   MCV 84.1  80 - 97 fL   MCH, POC 27.1  27 - 31.2 pg   MCHC 32.2  31.8 - 35.4 g/dL   RDW, POC 04.5     Platelet Count, POC 431 (*) 142 - 424 K/uL   MPV 7.5  0 - 99.8 fL  GLUCOSE, POCT (MANUAL RESULT ENTRY)      Result Value Range   POC Glucose 117 (*) 70 - 99 mg/dl  POCT UA - MICROSCOPIC ONLY      Result Value Range   WBC, Ur, HPF, POC rare     RBC, urine, microscopic neg     Bacteria, U Microscopic neg     Mucus, UA neg     Epithelial cells, urine per micros neg     Crystals, Ur, HPF, POC neg     Casts, Ur, LPF, POC neg     Yeast, UA neg    POCT URINALYSIS DIPSTICK      Result Value Range   Color, UA yellow     Clarity, UA clear     Glucose, UA neg     Bilirubin, UA neg     Ketones, UA neg     Spec Grav, UA 1.015     Blood, UA trace-intact     pH, UA 5.0     Protein, UA neg     Urobilinogen, UA 0.2     Nitrite, UA neg     Leukocytes, UA Negative    POCT GLYCOSYLATED HEMOGLOBIN (HGB A1C)      Result Value Range   Hemoglobin A1C 5.2     EKG NSR. No ischemia.  No LVH.    Assessment & Plan:  HTN (hypertension) - Plan: Comprehensive metabolic panel, TSH  Dizziness - Plan: POCT CBC, POCT glucose (manual entry), POCT UA - Microscopic Only, POCT urinalysis dipstick, Comprehensive metabolic panel, TSH, EKG 12-Lead  Nocturia - Plan: POCT glucose (manual entry), POCT UA - Microscopic Only, POCT urinalysis dipstick, PSA, Urine culture  Hyperglycemia - Plan: POCT glycosylated hemoglobin (Hb A1C)  Increased urinary frequency - Plan: Urine culture   If UCx and PSA are normal, plan Flomax for treatment of BPH as likely cause of nocturia and increased urinary frequency.  Dizziness may be caused/exacerbated by his sleep deprivation due to nocturia, but he may also need evaluation with cardiology.  He had a brief admission with CP in 04/2012, and his  diagnoses at that time included EtOH and cocaine use.  Discussed with Dr. Patsy Lager.

## 2012-11-28 LAB — PSA: PSA: 2.75 ng/mL (ref <=4.00)

## 2012-11-29 DIAGNOSIS — E0590 Thyrotoxicosis, unspecified without thyrotoxic crisis or storm: Secondary | ICD-10-CM | POA: Insufficient documentation

## 2012-11-29 DIAGNOSIS — N401 Enlarged prostate with lower urinary tract symptoms: Secondary | ICD-10-CM | POA: Insufficient documentation

## 2012-11-29 LAB — URINE CULTURE: Colony Count: NO GROWTH

## 2012-11-29 MED ORDER — TAMSULOSIN HCL 0.4 MG PO CAPS: 0.4000 mg | ORAL_CAPSULE | Freq: Every day | ORAL | Status: DC

## 2012-11-29 MED ORDER — METHIMAZOLE 10 MG PO TABS: 10.0000 mg | ORAL_TABLET | Freq: Three times a day (TID) | ORAL | Status: DC

## 2012-11-29 NOTE — Addendum Note (Signed)
Addended by: Fernande Bras on: 11/29/2012 02:56 PM   Modules accepted: Orders

## 2012-12-01 ENCOUNTER — Encounter: Payer: Self-pay | Admitting: Physician Assistant

## 2012-12-01 NOTE — Progress Notes (Signed)
This encounter was created in error - please disregard.

## 2012-12-04 ENCOUNTER — Ambulatory Visit
Admission: RE | Admit: 2012-12-04 | Discharge: 2012-12-04 | Disposition: A | Discharge status: Home / Self Care | Payer: BC Managed Care – PPO | Source: Ambulatory Visit | Attending: Physician Assistant | Admitting: Physician Assistant

## 2012-12-05 ENCOUNTER — Other Ambulatory Visit: Payer: Self-pay

## 2012-12-11 ENCOUNTER — Other Ambulatory Visit: Payer: Self-pay | Admitting: Internal Medicine

## 2012-12-11 DIAGNOSIS — E041 Nontoxic single thyroid nodule: Secondary | ICD-10-CM

## 2012-12-11 DIAGNOSIS — E0590 Thyrotoxicosis, unspecified without thyrotoxic crisis or storm: Secondary | ICD-10-CM

## 2013-01-01 ENCOUNTER — Encounter (HOSPITAL_COMMUNITY)
Admission: RE | Admit: 2013-01-01 | Discharge: 2013-01-01 | Disposition: A | Discharge status: Home / Self Care | Payer: BC Managed Care – PPO | Source: Ambulatory Visit | Attending: Internal Medicine | Admitting: Internal Medicine

## 2013-01-01 DIAGNOSIS — E0590 Thyrotoxicosis, unspecified without thyrotoxic crisis or storm: Secondary | ICD-10-CM

## 2013-01-01 DIAGNOSIS — E041 Nontoxic single thyroid nodule: Secondary | ICD-10-CM

## 2013-01-02 ENCOUNTER — Encounter (HOSPITAL_COMMUNITY)
Admission: RE | Admit: 2013-01-02 | Discharge: 2013-01-02 | Disposition: A | Discharge status: Home / Self Care | Payer: BC Managed Care – PPO | Source: Ambulatory Visit | Attending: Internal Medicine | Admitting: Internal Medicine

## 2013-01-02 DIAGNOSIS — E049 Nontoxic goiter, unspecified: Secondary | ICD-10-CM | POA: Insufficient documentation

## 2013-01-02 MED ORDER — SODIUM PERTECHNETATE TC 99M INJECTION
10.0000 | Freq: Once | INTRAVENOUS | Status: AC | PRN
  Administered 2013-01-02: 10 via INTRAVENOUS

## 2013-01-02 MED ORDER — SODIUM IODIDE I 131 CAPSULE
15.9000 | Freq: Once | INTRAVENOUS | Status: AC | PRN
  Administered 2013-01-01: 15.9 via ORAL

## 2013-01-28 ENCOUNTER — Ambulatory Visit (INDEPENDENT_AMBULATORY_CARE_PROVIDER_SITE_OTHER): Payer: BC Managed Care – PPO | Admitting: Family Medicine

## 2013-01-28 VITALS — BP 116/80 | HR 87 | Temp 98.6°F | Resp 18 | Ht 67.5 in | Wt 142.0 lb

## 2013-01-28 DIAGNOSIS — R351 Nocturia: Secondary | ICD-10-CM

## 2013-01-28 LAB — POCT UA - MICROSCOPIC ONLY
Bacteria, U Microscopic: NEGATIVE
Casts, Ur, LPF, POC: NEGATIVE
Mucus, UA: NEGATIVE
WBC, Ur, HPF, POC: NEGATIVE
Yeast, UA: NEGATIVE

## 2013-01-28 LAB — POCT URINALYSIS DIPSTICK
Bilirubin, UA: NEGATIVE
Glucose, UA: NEGATIVE
Nitrite, UA: NEGATIVE
Urobilinogen, UA: 0.2

## 2013-01-28 NOTE — Progress Notes (Signed)
[facility] [address] [place] [address] [phone]  Date:  01/28/2013   Name:  Philip [name]   DOB:  1951-05-26   MRN:  [id number]  PCP:  Laurena Slimmer, MD    Chief Complaint: bladder issues   History of Present Illness:  Philip [name] is a 62 y.o. very pleasant male patient who presents with the following:  Last seen by Porfirio Oar PA-C in february of this year.  He had complaint of nocturia and was treated with flonmax.  He had a PSA of 2.75 and a negtive urine culture.  He also had a normal hemoglobin A1c nad normal renal function at that time  I also noted that his TSH was quite supressed in February, and he was referred to an endocinrologist.  He states he had a thyroid scan and is supposed to have an ablation but he has not had this done yet. Encouraged him to follow- up this issue  He has been recently treated for cocaine use and alcohol abuse.    He took flomax for about one month, but has not noted any improvement in his symptoms.  He has nocturia X 8 or 9 at night, and goes "a lot" during the day. No hematuria or dysuria.   Patient Active Problem List  Diagnosis  . Substance abuse  . HTN (hypertension)  . Hyperthyroidism  . Benign prostatic hypertrophy (BPH) with nocturia    Past Medical History  Diagnosis Date  . Hypertension   . Substance abuse     EtOH, cocaine; last use 05/2012  . Alcohol dependence 04/23/2012    No past surgical history on file.  History  Substance Use Topics  . Smoking status: Current Every Day Smoker -- 1.50 packs/day for 25 years    Types: Cigarettes  . Smokeless tobacco: Not on file     Comment: has cut down to 1 ppd  . Alcohol Use: No     Comment: No EtOH since 05/2012    Family History  Problem Relation Age of Onset  . Cancer Mother     lung CA  . Hypertension Mother   . Hyperlipidemia Mother     No Known Allergies  Medication list has been reviewed and updated.  Current  Outpatient Prescriptions on File Prior to Visit  Medication Sig Dispense Refill  . Cyanocobalamin (B-12 PO) Take by mouth. Liquid product-takes it along with the tablet      . losartan-hydrochlorothiazide (HYZAAR) 100-25 MG per tablet Take 1 tablet by mouth daily. For blood pressure control      . Multiple Vitamins-Minerals (MULTIVITAMIN WITH MINERALS) tablet Take 1 tablet by mouth daily. Multivitamin supplement      . Tamsulosin HCl (FLOMAX) 0.4 MG CAPS Take 1 capsule (0.4 mg total) by mouth daily.  30 capsule  3   No current facility-administered medications on file prior to visit.    Review of Systems:  As per HPI- otherwise negative.'  Physical Examination: Filed Vitals:   01/28/13 0924  BP: 156/74  Pulse: 87  Temp: 98.6 F (37 C)  Resp: 18   Filed Vitals:   01/28/13 0924  Height: 5' 7.5" (1.715 m)  Weight: 142 lb (64.411 kg)   Body mass index is 21.9 kg/(m^2). Ideal Body Weight: Weight in (lb) to have BMI = 25: 161.7  GEN: WDWN, NAD, Non-toxic, A & O x 3, slim build, smoker HEENT: Atraumatic, Normocephalic. Neck supple. No masses, No LAD. Ears and  Nose: No external deformity. CV: RRR, No M/G/R. No JVD. No thrill. No extra heart sounds. PULM: CTA B, no wheezes, crackles, rhonchi. No retractions. No resp. distress. No accessory muscle use. ABD: S, NT, ND. No rebound. No HSM. EXTR: No c/c/e NEURO Normal gait.  PSYCH: Normally interactive. Conversant. Not depressed or anxious appearing.  Calm demeanor.  Gu: normal external genitalia, no hernia.  Prostate is somewhat enlarged and non- tender  Results for orders placed in visit on 01/28/13  POCT UA - MICROSCOPIC ONLY      Result Value Range   WBC, Ur, HPF, POC neg     RBC, urine, microscopic 0-2     Bacteria, U Microscopic neg     Mucus, UA neg     Epithelial cells, urine per micros neg     Crystals, Ur, HPF, POC neg     Casts, Ur, LPF, POC neg     Yeast, UA neg    POCT URINALYSIS DIPSTICK      Result Value Range    Color, UA yellow     Clarity, UA clear     Glucose, UA neg     Bilirubin, UA neg     Ketones, UA neg     Spec Grav, UA <=1.005     Blood, UA trace     pH, UA 5.5     Protein, UA neg     Urobilinogen, UA 0.2     Nitrite, UA neg     Leukocytes, UA Negative      Assessment and Plan: Nocturia - Plan: Ambulatory referral to Urology, POCT UA - Microscopic Only, POCT urinalysis dipstick  Persistent nocturia despite flomax, presumably due to BPH.  Referral to urology.   Encouraged him to quit smoking.   Any changes in the meantime please let us know  Signed Abbe Amsterdam, MD

## 2013-01-28 NOTE — Patient Instructions (Addendum)
Please call your endocrine doctor about your thyroid treatment. I will set you up to see urology about your frequent urination

## 2013-02-21 ENCOUNTER — Other Ambulatory Visit: Payer: Self-pay | Admitting: Family Medicine

## 2013-05-06 ENCOUNTER — Ambulatory Visit (INDEPENDENT_AMBULATORY_CARE_PROVIDER_SITE_OTHER): Payer: BC Managed Care – PPO | Admitting: Family Medicine

## 2013-05-06 VITALS — BP 152/92 | HR 97 | Temp 98.1°F | Resp 18 | Ht 66.5 in | Wt 142.2 lb

## 2013-05-06 DIAGNOSIS — R319 Hematuria, unspecified: Secondary | ICD-10-CM

## 2013-05-06 DIAGNOSIS — F17200 Nicotine dependence, unspecified, uncomplicated: Secondary | ICD-10-CM | POA: Insufficient documentation

## 2013-05-06 DIAGNOSIS — L748 Other eccrine sweat disorders: Secondary | ICD-10-CM

## 2013-05-06 DIAGNOSIS — I10 Essential (primary) hypertension: Secondary | ICD-10-CM

## 2013-05-06 DIAGNOSIS — E0590 Thyrotoxicosis, unspecified without thyrotoxic crisis or storm: Secondary | ICD-10-CM

## 2013-05-06 LAB — POCT CBC
HCT, POC: 44.5 % (ref 43.5–53.7)
Hemoglobin: 14.5 g/dL (ref 14.1–18.1)
Lymph, poc: 1.7 (ref 0.6–3.4)
MCH, POC: 28 pg (ref 27–31.2)
MCHC: 32.6 g/dL (ref 31.8–35.4)
MCV: 86.1 fL (ref 80–97)
POC LYMPH PERCENT: 26.8 %L (ref 10–50)
RDW, POC: 15.1 %
WBC: 6.5 10*3/uL (ref 4.6–10.2)

## 2013-05-06 LAB — POCT URINALYSIS DIPSTICK
Bilirubin, UA: NEGATIVE
Glucose, UA: NEGATIVE
Ketones, UA: NEGATIVE
Nitrite, UA: NEGATIVE
Spec Grav, UA: 1.015
pH, UA: 6

## 2013-05-06 LAB — GLUCOSE, POCT (MANUAL RESULT ENTRY): POC Glucose: 106 mg/dl — AB (ref 70–99)

## 2013-05-06 MED ORDER — ALUMINUM CHLORIDE 20 % EX SOLN: Freq: Every day | CUTANEOUS | Status: DC

## 2013-05-06 NOTE — Patient Instructions (Addendum)
I strongly suspect your increase in body odor and sweating is due to your overactive thyroid gland.  Make sure you call Dr. Burna Forts office to follow-up on your recommended radiation treatment for your thyroid as I suspect your odor problems may continue until you get this treated.  Call Dr. Ophelia Charter office at [phone] to get your appointment scheduled.  The drysol is a prescription strength antiperspirant which should be applied nightly until improvement is noted; significant improvement may be noted within one week. The interval between applications can then be gradually lengthened, with once weekly applications typically needed for maintenance therapy.  Unfortunately, treatment with strong antiperspirants is often limited by skin irritation (especially in the axillary region). To reduce the risk of irritation, these products should be applied to dry skin between episodes of sweating. Ideally, it should be applied at bedtime when sweating is at a minimum and washed off in the morning. Others have recommended using a hair dryer to quickly dry the application, or to use baking soda powder in the morning to neutralize any remaining topical antiperspirant. Over-the-counter topical cortisone creams can help alleviate the axillary irritation that frequently occurs if needed.  You have had small amounts of blood in your urine increasing over the past 5 months.  You need to go to the urologist so we can make sure that this is not from cancer so please keep your appointment.

## 2013-05-06 NOTE — Progress Notes (Signed)
Subjective:    Patient ID: Philip [name], male    DOB: May 05, 1951, 62 y.o.   MRN: [id number] Chief Complaint  Patient presents with  . body odor   HPI  For the past month has noticed worse body odor.  Tried epsom salt and baking soda baths last night, rub vinegar on skin prior to shower w/o relief.  Has strong foul odor but can't describe any more - today at work the odor seem to go under into his safety glasses and made his eyes water, states that others at work don't want to work around him, they complain to work behind him, is very embarrassing.  Doesn't sweat much other than head.  Feels like odor is coming from chest. No bad breath.  No clothing stains - just the odor which he can't describe.   Has tried to stay away from spicy food, onions, but did start taking garlic pills a week ago to help. No sig changes in diet - roast beef, occ veggies or fruits but not to much of one thing. Happens to him every summer but usu bearable - just much worse this morning.  Smokes  Past Medical History  Diagnosis Date  . Hypertension   . Substance abuse     EtOH, cocaine; last use 05/2012  . Alcohol dependence 04/23/2012   Current Outpatient Prescriptions on File Prior to Visit  Medication Sig Dispense Refill  . Cyanocobalamin (B-12 PO) Take by mouth. Liquid product-takes it along with the tablet      . losartan-hydrochlorothiazide (HYZAAR) 100-25 MG per tablet Take 1 tablet by mouth daily. For blood pressure control      . Multiple Vitamins-Minerals (MULTIVITAMIN WITH MINERALS) tablet Take 1 tablet by mouth daily. Multivitamin supplement      . losartan-hydrochlorothiazide (HYZAAR) 100-25 MG per tablet TAKE 1 TABLET BY MOUTH DAILY.  90 tablet  0  . Tamsulosin HCl (FLOMAX) 0.4 MG CAPS Take 1 capsule (0.4 mg total) by mouth daily.  30 capsule  3   No current facility-administered medications on file prior to visit.   No Known Allergies History   Social History  . Marital Status: Married   Spouse Name: Doris    Number of Children: 0  . 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 Smoking status: Current Every Day Smoker -- 1.50 packs/day for 25 years    Types: Cigarettes  . Smokeless tobacco: None     Comment: has cut down to 1 ppd  . Alcohol Use: No     Comment: No EtOH since 05/2012  . Drug Use: No     Comment: No illicits since 05/2012  . Sexually Active: Yes -- Male partner(s)   Other Topics Concern  . None   Social History Narrative   Lives with his wife and their dog.   Family History  Problem Relation Age of Onset  . Cancer Mother     lung CA  . Hypertension Mother   . Hyperlipidemia Mother     Review of Systems  Constitutional: Positive for diaphoresis. Negative for fever, chills, activity change, appetite change, fatigue and unexpected weight change.  Eyes: Negative for visual disturbance.  Respiratory: Negative for shortness of breath.   Cardiovascular: Negative for chest pain and leg swelling.  Endocrine: Positive for heat intolerance.  Genitourinary: Positive for frequency. Negative for dysuria, urgency, hematuria, decreased urine volume and enuresis.  Skin:  Negative for color change, pallor and rash.  Neurological: Negative for dizziness, syncope, facial asymmetry, weakness, light-headedness and headaches.  Hematological: Negative for adenopathy. Does not bruise/bleed easily.      BP 152/92  Pulse 97  Temp(Src) 98.1 F (36.7 C) (Oral)  Resp 18  Ht 5' 6.5" (1.689 m)  Wt 142 lb 3.2 oz (64.501 kg)  BMI 22.61 kg/m2  SpO2 100% Objective:   Physical Exam  Constitutional: He is oriented to person, place, and time. He appears well-developed. No distress.  No noted odor, upon very close inspection smells of cigarette smoke.  HENT:  Head: Normocephalic and atraumatic.  Eyes: No scleral icterus.  Pulmonary/Chest: Effort normal.  Neurological: He is alert and oriented  to person, place, and time.  Skin: Skin is warm and dry. He is not diaphoretic.  Psychiatric: He has a normal mood and affect. His behavior is normal.      Results for orders placed in visit on 05/06/13  POCT CBC      Result Value Range   WBC 6.5  4.6 - 10.2 K/uL   Lymph, poc 1.7  0.6 - 3.4   POC LYMPH PERCENT 26.8  10 - 50 %L   MID (cbc) 0.5  0 - 0.9   POC MID % 7.8  0 - 12 %M   POC Granulocyte 4.3  2 - 6.9   Granulocyte percent 65.4  37 - 80 %G   RBC 5.17  4.69 - 6.13 M/uL   Hemoglobin 14.5  14.1 - 18.1 g/dL   HCT, POC 16.1  09.6 - 53.7 %   MCV 86.1  80 - 97 fL   MCH, POC 28.0  27 - 31.2 pg   MCHC 32.6  31.8 - 35.4 g/dL   RDW, POC 04.5     Platelet Count, POC 363  142 - 424 K/uL   MPV 7.3  0 - 99.8 fL  GLUCOSE, POCT (MANUAL RESULT ENTRY)      Result Value Range   POC Glucose 106 (*) 70 - 99 mg/dl  POCT URINALYSIS DIPSTICK      Result Value Range   Color, UA yellow     Clarity, UA clear     Glucose, UA neg     Bilirubin, UA neg     Ketones, UA neg     Spec Grav, UA 1.015     Blood, UA small     pH, UA 6.0     Protein, UA neg     Urobilinogen, UA 0.2     Nitrite, UA neg     Leukocytes, UA Negative     Assessment & Plan:   Body odor - Plan: POCT CBC, POCT glucose (manual entry), POCT urinalysis dipstick, Comprehensive metabolic panel, Cytology - Non PAP; - unknown etiology so screening labs done. No odor appreciable today on exam other than cigarette smoke.  Pt very distressed - doesn't want to go back to work so given 1d off and start trial of drysol - see pt instructions.  I suspect this maybe at least in part caused by her hyperthyroidism so advised to f/u on trx for that for long-term solution to his concerns.  Microscopic Hematuria - Plan: Urine culture, POCT UA - Microscopic Only, Ambulatory referral to Urology, Cytology - Non PAP; on all 3 UA here since Feb. Was referred to [facility] in April when seen by Dr. Dallas Schimke for nocturia unrelieved by flomax. No  showed mid-May appt - pt stated just had  to many dr.'s appts and to many things wrong with him.  Hyperthyroidism - was recommended to have radioiodine ablation but per pt never scheduled. Advised pt to contact Dr. Burna Forts office to follow up on NM appt.  HTN - uncontrolled - consider starting BB if pt does not f/u soon for Graves trx.     Meds ordered this encounter  Medications  . aluminum chloride (DRYSOL) 20 % external solution    Sig: Apply topically at bedtime.    Dispense:  60 mL    Refill:  1  See pt instructions. Apply qhs x 1 wk, wash off in morning and apply baking soda. Then wean back to avoid skin irritation.

## 2013-05-07 ENCOUNTER — Other Ambulatory Visit: Payer: Self-pay | Admitting: Internal Medicine

## 2013-05-07 LAB — COMPREHENSIVE METABOLIC PANEL
AST: 18 U/L (ref 0–37)
Albumin: 4.4 g/dL (ref 3.5–5.2)
BUN: 13 mg/dL (ref 6–23)
CO2: 30 mEq/L (ref 19–32)
Calcium: 9.6 mg/dL (ref 8.4–10.5)
Chloride: 95 mEq/L — ABNORMAL LOW (ref 96–112)
Potassium: 3.4 mEq/L — ABNORMAL LOW (ref 3.5–5.3)

## 2013-05-08 LAB — URINE CULTURE: Colony Count: NO GROWTH

## 2013-05-08 LAB — CYTOLOGY - NON PAP

## 2013-05-12 ENCOUNTER — Encounter (HOSPITAL_COMMUNITY)
Admission: RE | Admit: 2013-05-12 | Discharge: 2013-05-12 | Disposition: A | Discharge status: Home / Self Care | Payer: BC Managed Care – PPO | Source: Ambulatory Visit | Attending: Internal Medicine | Admitting: Internal Medicine

## 2013-05-12 DIAGNOSIS — E0590 Thyrotoxicosis, unspecified without thyrotoxic crisis or storm: Secondary | ICD-10-CM | POA: Insufficient documentation

## 2013-05-12 MED ORDER — SODIUM IODIDE I 131 CAPSULE
26.5000 | Freq: Once | INTRAVENOUS | Status: AC | PRN
  Administered 2013-05-12: 26.5 via ORAL

## 2013-05-20 ENCOUNTER — Ambulatory Visit: Payer: BC Managed Care – PPO

## 2013-05-20 ENCOUNTER — Ambulatory Visit (INDEPENDENT_AMBULATORY_CARE_PROVIDER_SITE_OTHER): Payer: BC Managed Care – PPO | Admitting: Family Medicine

## 2013-05-20 VITALS — BP 132/86 | HR 130 | Temp 97.7°F | Resp 18 | Ht 66.0 in | Wt 135.6 lb

## 2013-05-20 DIAGNOSIS — F17200 Nicotine dependence, unspecified, uncomplicated: Secondary | ICD-10-CM

## 2013-05-20 DIAGNOSIS — R000 Tachycardia, unspecified: Secondary | ICD-10-CM

## 2013-05-20 DIAGNOSIS — J209 Acute bronchitis, unspecified: Secondary | ICD-10-CM

## 2013-05-20 DIAGNOSIS — I10 Essential (primary) hypertension: Secondary | ICD-10-CM

## 2013-05-20 DIAGNOSIS — R05 Cough: Secondary | ICD-10-CM

## 2013-05-20 DIAGNOSIS — I498 Other specified cardiac arrhythmias: Secondary | ICD-10-CM

## 2013-05-20 DIAGNOSIS — E0590 Thyrotoxicosis, unspecified without thyrotoxic crisis or storm: Secondary | ICD-10-CM

## 2013-05-20 LAB — POCT CBC
Lymph, poc: 1.2 (ref 0.6–3.4)
MCH, POC: 28.6 pg (ref 27–31.2)
MCHC: 33.7 g/dL (ref 31.8–35.4)
MID (cbc): 0.4 (ref 0–0.9)
MPV: 7.7 fL (ref 0–99.8)
POC Granulocyte: 3.2 (ref 2–6.9)
POC MID %: 7.6 %M (ref 0–12)
Platelet Count, POC: 272 10*3/uL (ref 142–424)
RBC: 5.59 M/uL (ref 4.69–6.13)
WBC: 4.8 10*3/uL (ref 4.6–10.2)

## 2013-05-20 MED ORDER — HYDROCODONE-HOMATROPINE 5-1.5 MG/5ML PO SYRP: 5.0000 mL | ORAL_SOLUTION | Freq: Three times a day (TID) | ORAL | Status: DC | PRN

## 2013-05-20 MED ORDER — DOXYCYCLINE HYCLATE 100 MG PO CAPS: 100.0000 mg | ORAL_CAPSULE | Freq: Two times a day (BID) | ORAL | Status: DC

## 2013-05-20 MED ORDER — ALBUTEROL SULFATE (2.5 MG/3ML) 0.083% IN NEBU
2.5000 mg | INHALATION_SOLUTION | Freq: Once | RESPIRATORY_TRACT | Status: AC
  Administered 2013-05-20: 2.5 mg via RESPIRATORY_TRACT

## 2013-05-20 MED ORDER — IPRATROPIUM BROMIDE 0.02 % IN SOLN
0.5000 mg | Freq: Once | RESPIRATORY_TRACT | Status: AC
  Administered 2013-05-20: 0.5 mg via RESPIRATORY_TRACT

## 2013-05-20 MED ORDER — PREDNISONE 20 MG PO TABS: 40.0000 mg | ORAL_TABLET | Freq: Every day | ORAL | Status: DC

## 2013-05-20 MED ORDER — IPRATROPIUM-ALBUTEROL 20-100 MCG/ACT IN AERS: 1.0000 | INHALATION_SPRAY | Freq: Four times a day (QID) | RESPIRATORY_TRACT | Status: DC | PRN

## 2013-05-20 NOTE — Progress Notes (Signed)
Subjective:    Patient ID: Philip [name], male    DOB: 04/22/1951, 62 y.o.   MRN: [id number] Chief Complaint  Patient presents with  . Cough    non productive  . Fatigue    x 3 days    HPI  Went and took the 131-iodone pill last Thursday - had to stay away from people and work for 3d.  Has appt with Dr. Chestine Spore next Tuesday for f/u and possibly start levothyroxine.   His wife had the flu.  Since then he has been very fatigued, unable to work and sleeping a lot.  Feels like he is getting a chest cold with dry cough and dry mouth.  No wheezing or SHoB but has not exerted himself at all. No CP or tightness.  Not sig congestion though he is sniffling freq in office but does have some sore throat.  Has been taking chest cough medicine and theraflu as well as some other otc cold pill.  Seeping well but no appetite but is drinking some water. No f/c but is having sweats.  Smoking about 1/2 ppd.  Has appt with urologist for persistent microscopic hematuria and urinary frequency on 06/05/13.  Past Medical History  Diagnosis Date  . Hypertension   . Substance abuse     EtOH, cocaine; last use 05/2012  . Alcohol dependence 04/23/2012  . Thyroid disease    Current Outpatient Prescriptions on File Prior to Visit  Medication Sig Dispense Refill  . aluminum chloride (DRYSOL) 20 % external solution Apply topically at bedtime.  60 mL  1  . Cyanocobalamin (B-12 PO) Take by mouth. Liquid product-takes it along with the tablet      . losartan-hydrochlorothiazide (HYZAAR) 100-25 MG per tablet Take 1 tablet by mouth daily. For blood pressure control      . losartan-hydrochlorothiazide (HYZAAR) 100-25 MG per tablet TAKE 1 TABLET BY MOUTH DAILY.  90 tablet  0  . Multiple Vitamins-Minerals (MULTIVITAMIN WITH MINERALS) tablet Take 1 tablet by mouth daily. Multivitamin supplement      . Tamsulosin HCl (FLOMAX) 0.4 MG CAPS Take 1 capsule (0.4 mg total) by mouth daily.  30 capsule  3   No current  facility-administered medications on file prior to visit.   No Known Allergies   Review of Systems  Constitutional: Positive for diaphoresis, activity change, appetite change and fatigue. Negative for fever and chills.  HENT: Positive for sore throat and trouble swallowing. Negative for ear pain, congestion, rhinorrhea, sneezing, neck pain, neck stiffness, postnasal drip, sinus pressure and ear discharge.   Respiratory: Positive for cough. Negative for choking, shortness of breath and wheezing.   Cardiovascular: Negative for chest pain.  Gastrointestinal: Negative for nausea, vomiting, abdominal pain, diarrhea and constipation.  Genitourinary: Negative for dysuria and difficulty urinating.  Musculoskeletal: Positive for arthralgias. Negative for myalgias.  Hematological: Negative for adenopathy.  Psychiatric/Behavioral: Negative for sleep disturbance.      BP 132/86  Pulse 130  Temp(Src) 97.7 F (36.5 C) (Oral)  Resp 18  Ht 5\' 6"  (1.676 m)  Wt 135 lb 9.6 oz (61.508 kg)  BMI 21.9 kg/m2  SpO2 97% Objective:   Physical Exam  Constitutional: He is oriented to person, place, and time. He appears well-developed and well-nourished. No distress.  HENT:  Head: Normocephalic and atraumatic.  Right Ear: Tympanic membrane, external ear and ear canal normal.  Left Ear: Tympanic membrane, external ear and ear canal normal.  Nose: Mucosal edema present. No rhinorrhea. Right sinus exhibits  no maxillary sinus tenderness. Left sinus exhibits no maxillary sinus tenderness.  Mouth/Throat: Uvula is midline and mucous membranes are normal. No trismus in the jaw. No edematous. Posterior oropharyngeal erythema present. No oropharyngeal exudate or posterior oropharyngeal edema.  Eyes: Conjunctivae are normal. No scleral icterus.  Neck: Normal range of motion. Neck supple. Mass present.  Cardiovascular: Regular rhythm, normal heart sounds and intact distal pulses.  Tachycardia present.   Pulmonary/Chest:  Effort normal and breath sounds normal. No respiratory distress.  Musculoskeletal: He exhibits no edema.  Lymphadenopathy:    He has no cervical adenopathy.  Neurological: He is alert and oriented to person, place, and time.  Skin: Skin is warm and dry. He is not diaphoretic. No erythema.  Psychiatric: He has a normal mood and affect. His behavior is normal.      Results for orders placed in visit on 05/20/13  POCT CBC      Result Value Range   WBC 4.8  4.6 - 10.2 K/uL   Lymph, poc 1.2  0.6 - 3.4   POC LYMPH PERCENT 25.0  10 - 50 %L   MID (cbc) 0.4  0 - 0.9   POC MID % 7.6  0 - 12 %M   POC Granulocyte 3.2  2 - 6.9   Granulocyte percent 67.4  37 - 80 %G   RBC 5.59  4.69 - 6.13 M/uL   Hemoglobin 16.0  14.1 - 18.1 g/dL   HCT, POC 16.1  09.6 - 53.7 %   MCV 85.0  80 - 97 fL   MCH, POC 28.6  27 - 31.2 pg   MCHC 33.7  31.8 - 35.4 g/dL   RDW, POC 04.5     Platelet Count, POC 272  142 - 424 K/uL   MPV 7.7  0 - 99.8 fL   [facility] reading (PRIMARY) by  Dr. Clelia Croft. CXR: flattened diaphragms, no infiltrate, nml lung fields.   EKG: Sinus tach, atrial enlargement. Peak flow: 360 - goal 570 - given duoneb in the office x 1. Assessment & Plan:  Tobacco use disorder - Plan: POCT CBC, EKG 12-Lead, DG Chest 2 View - Encouraged cessation - thinking about switching to e-cig  Hyperthyroidism - Plan: POCT CBC, EKG 12-Lead - currently under treatment by radioiodine ablation with Dr. Chestine Spore  HTN (hypertension) - Plan: POCT CBC, EKG 12-Lead  Sinus tachycardia - Plan: DG Chest 2 View - likely due to thyroid, dehydration, and otc cough/cold meds. Stop otc cough/cold meds and push fluids.  Cough - Plan: albuterol (PROVENTIL) (2.5 MG/3ML) 0.083% nebulizer solution 2.5 mg, ipratropium (ATROVENT) nebulizer solution 0.5 mg, DG Chest 2 View - pt felt much better after duoneb so try combivent as o/p.  Acute bronchitis - Plan: DG Chest 2 View - will cover for copd exacerbation consider long smoking hx and rec RTC  in 2 wks for spirometry to check for COPD.  Meds ordered this encounter  Medications  . albuterol (PROVENTIL) (2.5 MG/3ML) 0.083% nebulizer solution 2.5 mg    Sig:   . ipratropium (ATROVENT) nebulizer solution 0.5 mg    Sig:   . predniSONE (DELTASONE) 20 MG tablet    Sig: Take 2 tablets (40 mg total) by mouth daily.    Dispense:  10 tablet    Refill:  0  . doxycycline (VIBRAMYCIN) 100 MG capsule    Sig: Take 1 capsule (100 mg total) by mouth 2 (two) times daily.    Dispense:  20 capsule    Refill:  0  . Ipratropium-Albuterol (COMBIVENT RESPIMAT) 20-100 MCG/ACT AERS respimat    Sig: Inhale 1 puff into the lungs every 6 (six) hours as needed for wheezing or shortness of breath (cough).    Dispense:  1 Inhaler    Refill:  5  . HYDROcodone-homatropine (HYCODAN) 5-1.5 MG/5ML syrup    Sig: Take 5 mL by mouth every 8 (eight) hours as needed for cough.    Dispense:  120 mL    Refill:  0

## 2013-05-20 NOTE — Patient Instructions (Addendum)
STOP ALL OF THE OVER THE COUNTER MEDICATIONS OTHER THAN GENERIC MUCINEX (GUAIFENESIN).  THEY ARE LIKELY CAUSING YOUR HEART RATE TO INCREASE.    Please come back to clinic in 2 weeks so we can make sure you are feeling better and do COPD testing at that time.  Bronchitis Bronchitis is the body's way of reacting to injury and/or infection (inflammation) of the bronchi. Bronchi are the air tubes that extend from the windpipe into the lungs. If the inflammation becomes severe, it may cause shortness of breath. CAUSES  Inflammation may be caused by:  A virus.  Germs (bacteria).  Dust.  Allergens.  Pollutants and many other irritants. The cells lining the bronchial tree are covered with tiny hairs (cilia). These constantly beat upward, away from the lungs, toward the mouth. This keeps the lungs free of pollutants. When these cells become too irritated and are unable to do their job, mucus begins to develop. This causes the characteristic cough of bronchitis. The cough clears the lungs when the cilia are unable to do their job. Without either of these protective mechanisms, the mucus would settle in the lungs. Then you would develop pneumonia. Smoking is a common cause of bronchitis and can contribute to pneumonia. Stopping this habit is the single most important thing you can do to help yourself. TREATMENT   Your caregiver may prescribe an antibiotic if the cough is caused by bacteria. Also, medicines that open up your airways make it easier to breathe. Your caregiver may also recommend or prescribe an expectorant. It will loosen the mucus to be coughed up. Only take over-the-counter or prescription medicines for pain, discomfort, or fever as directed by your caregiver.  Removing whatever causes the problem (smoking, for example) is critical to preventing the problem from getting worse.  Cough suppressants may be prescribed for relief of cough symptoms.  Inhaled medicines may be prescribed to  help with symptoms now and to help prevent problems from returning.  For those with recurrent (chronic) bronchitis, there may be a need for steroid medicines. SEEK IMMEDIATE MEDICAL CARE IF:   During treatment, you develop more pus-like mucus (purulent sputum).  You have a fever.  Your baby is older than 3 months with a rectal temperature of 102 F (38.9 C) or higher.  Your baby is 86 months old or younger with a rectal temperature of 100.4 F (38 C) or higher.  You become progressively more ill.  You have increased difficulty breathing, wheezing, or shortness of breath. It is necessary to seek immediate medical care if you are elderly or sick from any other disease. MAKE SURE YOU:   Understand these instructions.  Will watch your condition.  Will get help right away if you are not doing well or get worse. Document Released: 09/24/2005 Document Revised: 12/17/2011 Document Reviewed: 08/03/2008 [facility] Patient Information 2014 [place], [place].

## 2013-05-21 ENCOUNTER — Telehealth: Payer: Self-pay

## 2013-05-21 ENCOUNTER — Encounter: Payer: Self-pay | Admitting: Family Medicine

## 2013-05-21 NOTE — Telephone Encounter (Signed)
Dr. Clelia Croft,   Patient is sick and throwing up same as Wednesday, when he saw you last.   [phone]

## 2013-05-22 NOTE — Telephone Encounter (Signed)
I do not remember discussing nausea/vomiting w/ pt and should be caused by bronchitis/copd flair that I thought he had.  Could be from the medications - prednisone/doxy.  Rec stop prednisone and come back to clinic for further eval if sxs cont.

## 2013-05-22 NOTE — Telephone Encounter (Signed)
I do not see any thing about vomiting at office visit.

## 2013-05-22 NOTE — Telephone Encounter (Signed)
Called to advise. He is going to see Dr Chestine Spore on Monday. He will return her this weekend if worse.

## 2013-05-29 ENCOUNTER — Other Ambulatory Visit: Payer: Self-pay | Admitting: Physician Assistant

## 2013-06-08 DIAGNOSIS — N200 Calculus of kidney: Secondary | ICD-10-CM

## 2013-06-08 HISTORY — DX: Calculus of kidney: N20.0

## 2013-06-29 ENCOUNTER — Other Ambulatory Visit: Payer: Self-pay | Admitting: Urology

## 2013-07-17 NOTE — Patient Instructions (Addendum)
20 Jru Pense Lehtinen  07/17/2013   Your procedure is scheduled on: 07-22-2013  Report to [facility] at 900 AM.  Call this number if you have problems the morning of surgery [phone]   Remember:   Do not eat food or drink liquids :After Midnight.     Take these medicines the morning of surgery with A SIP OF WATER: no meds to take                                SEE [facility] PREPARING FOR SURGERY SHEET             You may not have any metal on your body including hair pins and piercings  Do not wear jewelry, make-up.  Do not wear lotions, powders, or perfumes. You may wear deodorant.   Men may shave face and neck.  Do not bring valuables to the hospital. [facility] IS NOT RESPONSIBLE FOR VALUEABLES.  Contacts, dentures or bridgework may not be worn into surgery.  Leave suitcase in the car. After surgery it may be brought to your room.  For patients admitted to the hospital, checkout time is 11:00 AM the day of discharge.   Patients discharged the day of surgery will not be allowed to drive home.  Name and phone number of your driver:  Special Instructions: N/A   Please read over the following fact sheets that you were given:   Call Cain Sieve RN pre op nurse if needed [phone]    FAILURE TO FOLLOW THESE INSTRUCTIONS MAY RESULT IN THE CANCELLATION OF YOUR SURGERY.  PATIENT SIGNATURE___________________________________________  NURSE SIGNATURE_____________________________________________

## 2013-07-17 NOTE — Progress Notes (Signed)
EKG 05-20-13 EPIC  CHEST XRAY 05-20-13 EPIC

## 2013-07-20 ENCOUNTER — Encounter (HOSPITAL_COMMUNITY): Payer: Self-pay

## 2013-07-20 ENCOUNTER — Encounter (HOSPITAL_COMMUNITY): Payer: Self-pay | Admitting: Pharmacy Technician

## 2013-07-20 ENCOUNTER — Encounter (HOSPITAL_COMMUNITY)
Admission: RE | Admit: 2013-07-20 | Discharge: 2013-07-20 | Disposition: A | Discharge status: Home / Self Care | Payer: BC Managed Care – PPO | Source: Ambulatory Visit | Attending: Urology | Admitting: Urology

## 2013-07-20 HISTORY — DX: Gastro-esophageal reflux disease without esophagitis: K21.9

## 2013-07-20 HISTORY — DX: Nocturia: R35.1

## 2013-07-20 HISTORY — DX: Hypothyroidism, unspecified: E03.9

## 2013-07-20 HISTORY — DX: Dizziness and giddiness: R42

## 2013-07-20 LAB — BASIC METABOLIC PANEL
GFR calc Af Amer: >90 mL/min (ref >90)
GFR calc non Af Amer: >90 mL/min (ref >90)
Potassium: 3.7 mEq/L (ref 3.5–5.1)
Sodium: 136 mEq/L (ref 135–145)

## 2013-07-20 LAB — CBC
MCHC: 35.2 g/dL (ref 30.0–36.0)
RDW: 14.1 % (ref 11.5–15.5)

## 2013-07-20 NOTE — Progress Notes (Signed)
ekg 05-21-13 epic Chest xray 05-20-13 epic

## 2013-07-20 NOTE — Progress Notes (Signed)
ekg 05-20-13 epic Chest xray 05-20-13 epic

## 2013-07-21 MED ORDER — GENTAMICIN SULFATE 40 MG/ML IJ SOLN
310.0000 mg | INTRAVENOUS | Status: AC
  Administered 2013-07-22: 310 mg via INTRAVENOUS
  Filled 2013-07-21: qty 7.75

## 2013-07-22 ENCOUNTER — Ambulatory Visit (HOSPITAL_COMMUNITY): Payer: BC Managed Care – PPO | Admitting: Anesthesiology

## 2013-07-22 ENCOUNTER — Observation Stay (HOSPITAL_COMMUNITY)
Admission: RE | Admit: 2013-07-22 | Discharge: 2013-07-23 | Disposition: A | Discharge status: Home / Self Care | Payer: BC Managed Care – PPO | Source: Ambulatory Visit | Attending: Urology | Admitting: Urology

## 2013-07-22 ENCOUNTER — Encounter (HOSPITAL_COMMUNITY)
Admission: RE | Disposition: A | Discharge status: Home / Self Care | Payer: Self-pay | Source: Ambulatory Visit | Attending: Urology

## 2013-07-22 ENCOUNTER — Encounter (HOSPITAL_COMMUNITY): Payer: BC Managed Care – PPO | Admitting: Anesthesiology

## 2013-07-22 ENCOUNTER — Encounter (HOSPITAL_COMMUNITY): Payer: Self-pay | Admitting: *Deleted

## 2013-07-22 DIAGNOSIS — N200 Calculus of kidney: Secondary | ICD-10-CM | POA: Insufficient documentation

## 2013-07-22 DIAGNOSIS — N138 Other obstructive and reflux uropathy: Principal | ICD-10-CM | POA: Insufficient documentation

## 2013-07-22 DIAGNOSIS — F17200 Nicotine dependence, unspecified, uncomplicated: Secondary | ICD-10-CM | POA: Insufficient documentation

## 2013-07-22 DIAGNOSIS — N401 Enlarged prostate with lower urinary tract symptoms: Principal | ICD-10-CM | POA: Insufficient documentation

## 2013-07-22 DIAGNOSIS — R3129 Other microscopic hematuria: Secondary | ICD-10-CM | POA: Insufficient documentation

## 2013-07-22 DIAGNOSIS — I10 Essential (primary) hypertension: Secondary | ICD-10-CM | POA: Insufficient documentation

## 2013-07-22 DIAGNOSIS — Z01812 Encounter for preprocedural laboratory examination: Secondary | ICD-10-CM | POA: Insufficient documentation

## 2013-07-22 DIAGNOSIS — R350 Frequency of micturition: Secondary | ICD-10-CM | POA: Insufficient documentation

## 2013-07-22 DIAGNOSIS — E039 Hypothyroidism, unspecified: Secondary | ICD-10-CM | POA: Insufficient documentation

## 2013-07-22 DIAGNOSIS — R3915 Urgency of urination: Secondary | ICD-10-CM | POA: Insufficient documentation

## 2013-07-22 DIAGNOSIS — R351 Nocturia: Secondary | ICD-10-CM | POA: Insufficient documentation

## 2013-07-22 DIAGNOSIS — K219 Gastro-esophageal reflux disease without esophagitis: Secondary | ICD-10-CM | POA: Insufficient documentation

## 2013-07-22 HISTORY — PX: TRANSURETHRAL RESECTION OF PROSTATE: SHX73

## 2013-07-22 LAB — HEMOGLOBIN AND HEMATOCRIT, BLOOD: HCT: 35.7 % — ABNORMAL LOW (ref 39.0–52.0)

## 2013-07-22 SURGERY — TRANSURETHRAL RESECTION OF THE PROSTATE WITH GYRUS INSTRUMENTS
Anesthesia: General | Wound class: Clean Contaminated

## 2013-07-22 MED ORDER — KCL IN DEXTROSE-NACL 20-5-0.45 MEQ/L-%-% IV SOLN
INTRAVENOUS | Status: DC
  Administered 2013-07-22: 17:00:00 via INTRAVENOUS
  Filled 2013-07-22 (×2): qty 1000

## 2013-07-22 MED ORDER — LIDOCAINE HCL 2 % EX GEL
CUTANEOUS | Status: AC
  Filled 2013-07-22: qty 10

## 2013-07-22 MED ORDER — LACTATED RINGERS IV SOLN
INTRAVENOUS | Status: DC | PRN
  Administered 2013-07-22: 12:00:00 via INTRAVENOUS

## 2013-07-22 MED ORDER — OXYCODONE HCL 5 MG/5ML PO SOLN
5.0000 mg | Freq: Once | ORAL | Status: DC | PRN
Start: 2013-07-22 — End: 2013-07-22

## 2013-07-22 MED ORDER — HYDROCHLOROTHIAZIDE 25 MG PO TABS
25.0000 mg | ORAL_TABLET | Freq: Every day | ORAL | Status: DC
  Filled 2013-07-22: qty 1

## 2013-07-22 MED ORDER — SODIUM CHLORIDE 0.9 % IR SOLN
Status: DC | PRN
  Administered 2013-07-22: 12000 mL

## 2013-07-22 MED ORDER — OXYCODONE HCL 5 MG PO TABS: 5.0000 mg | ORAL_TABLET | ORAL | Status: DC | PRN

## 2013-07-22 MED ORDER — LOSARTAN POTASSIUM-HCTZ 100-25 MG PO TABS: 1.0000 | ORAL_TABLET | Freq: Every morning | ORAL | Status: DC

## 2013-07-22 MED ORDER — METHIMAZOLE 10 MG PO TABS
20.0000 mg | ORAL_TABLET | Freq: Every morning | ORAL | Status: DC
Start: 2013-07-22 — End: 2013-07-23
  Filled 2013-07-22 (×2): qty 2

## 2013-07-22 MED ORDER — HYDROMORPHONE HCL PF 1 MG/ML IJ SOLN: 0.2500 mg | INTRAMUSCULAR | Status: DC | PRN

## 2013-07-22 MED ORDER — LOSARTAN POTASSIUM 50 MG PO TABS
100.0000 mg | ORAL_TABLET | Freq: Every day | ORAL | Status: DC
  Filled 2013-07-22: qty 2

## 2013-07-22 MED ORDER — LIDOCAINE HCL (CARDIAC) 20 MG/ML IV SOLN
INTRAVENOUS | Status: DC | PRN
  Administered 2013-07-22: 75 mg via INTRAVENOUS

## 2013-07-22 MED ORDER — PROMETHAZINE HCL 25 MG/ML IJ SOLN: 6.2500 mg | INTRAMUSCULAR | Status: DC | PRN

## 2013-07-22 MED ORDER — HYDROMORPHONE HCL PF 1 MG/ML IJ SOLN: 0.5000 mg | INTRAMUSCULAR | Status: DC | PRN

## 2013-07-22 MED ORDER — ONDANSETRON HCL 4 MG/2ML IJ SOLN: 4.0000 mg | INTRAMUSCULAR | Status: DC | PRN

## 2013-07-22 MED ORDER — ONDANSETRON HCL 4 MG/2ML IJ SOLN
INTRAMUSCULAR | Status: DC | PRN
  Administered 2013-07-22: 4 mg via INTRAMUSCULAR

## 2013-07-22 MED ORDER — LACTATED RINGERS IV SOLN: INTRAVENOUS | Status: DC

## 2013-07-22 MED ORDER — ACETAMINOPHEN 500 MG PO TABS
1000.0000 mg | ORAL_TABLET | Freq: Four times a day (QID) | ORAL | Status: AC
  Administered 2013-07-22 – 2013-07-23 (×4): 1000 mg via ORAL
  Filled 2013-07-22 (×4): qty 2

## 2013-07-22 MED ORDER — EPHEDRINE SULFATE 50 MG/ML IJ SOLN
INTRAMUSCULAR | Status: DC | PRN
  Administered 2013-07-22: 5 mg via INTRAVENOUS

## 2013-07-22 MED ORDER — BELLADONNA ALKALOIDS-OPIUM 16.2-60 MG RE SUPP
RECTAL | Status: AC
  Filled 2013-07-22: qty 1

## 2013-07-22 MED ORDER — MEPERIDINE HCL 50 MG/ML IJ SOLN: 6.2500 mg | INTRAMUSCULAR | Status: DC | PRN

## 2013-07-22 MED ORDER — SENNA 8.6 MG PO TABS
1.0000 | ORAL_TABLET | Freq: Two times a day (BID) | ORAL | Status: DC
  Administered 2013-07-22: 8.6 mg via ORAL
  Filled 2013-07-22: qty 1

## 2013-07-22 MED ORDER — MIDAZOLAM HCL 5 MG/5ML IJ SOLN
INTRAMUSCULAR | Status: DC | PRN
  Administered 2013-07-22: 2 mg via INTRAVENOUS

## 2013-07-22 MED ORDER — OXYBUTYNIN CHLORIDE 5 MG PO TABS
5.0000 mg | ORAL_TABLET | Freq: Three times a day (TID) | ORAL | Status: DC | PRN
  Filled 2013-07-22: qty 1

## 2013-07-22 MED ORDER — DOCUSATE SODIUM 100 MG PO CAPS
100.0000 mg | ORAL_CAPSULE | Freq: Two times a day (BID) | ORAL | Status: DC
  Administered 2013-07-22: 22:00:00 100 mg via ORAL
  Filled 2013-07-22 (×3): qty 1

## 2013-07-22 MED ORDER — FENTANYL CITRATE 0.05 MG/ML IJ SOLN
INTRAMUSCULAR | Status: DC | PRN
  Administered 2013-07-22 (×3): 50 ug via INTRAVENOUS
  Administered 2013-07-22: 100 ug via INTRAVENOUS

## 2013-07-22 MED ORDER — ZOLPIDEM TARTRATE 5 MG PO TABS
5.0000 mg | ORAL_TABLET | Freq: Every evening | ORAL | Status: DC | PRN
  Administered 2013-07-22: 22:00:00 5 mg via ORAL
  Filled 2013-07-22: qty 1

## 2013-07-22 MED ORDER — PROPOFOL 10 MG/ML IV BOLUS
INTRAVENOUS | Status: DC | PRN
  Administered 2013-07-22: 160 mg via INTRAVENOUS

## 2013-07-22 MED ORDER — OXYCODONE HCL 5 MG PO TABS
5.0000 mg | ORAL_TABLET | Freq: Once | ORAL | Status: DC | PRN
Start: 2013-07-22 — End: 2013-07-22

## 2013-07-22 SURGICAL SUPPLY — 21 items
BAG URINE DRAINAGE (UROLOGICAL SUPPLIES) ×2 IMPLANT
BAG URO CATCHER STRL LF (DRAPE) ×2 IMPLANT
CATH FOLEY 3WAY 30CC 22FR (CATHETERS) ×2 IMPLANT
CLOTH BEACON ORANGE TIMEOUT ST (SAFETY) ×2 IMPLANT
DRAPE CAMERA CLOSED 9X96 (DRAPES) ×2 IMPLANT
ELECT BUTTON HF 24-28F 2 30DE (ELECTRODE) ×2 IMPLANT
ELECT LOOP MED HF 24F 12D (CUTTING LOOP) IMPLANT
ELECT LOOP MED HF 24F 12D CBL (CLIP) IMPLANT
ELECT RESECT VAPORIZE 12D CBL (ELECTRODE) ×2 IMPLANT
GLOVE BIOGEL M STRL SZ7.5 (GLOVE) ×2 IMPLANT
GOWN STRL REIN XL XLG (GOWN DISPOSABLE) ×2 IMPLANT
HOLDER FOLEY CATH W/STRAP (MISCELLANEOUS) IMPLANT
IV NS IRRIG 3000ML ARTHROMATIC (IV SOLUTION) ×4 IMPLANT
KIT ASPIRATION TUBING (SET/KITS/TRAYS/PACK) IMPLANT
MANIFOLD NEPTUNE II (INSTRUMENTS) ×2 IMPLANT
PACK CYSTO (CUSTOM PROCEDURE TRAY) ×2 IMPLANT
PLUG CATH AND CAP STER (CATHETERS) ×2 IMPLANT
SUT ETHILON 3 0 PS 1 (SUTURE) IMPLANT
SYR 30ML LL (SYRINGE) IMPLANT
SYRINGE IRR TOOMEY STRL 70CC (SYRINGE) ×2 IMPLANT
TUBING CONNECTING 10 (TUBING) ×6 IMPLANT

## 2013-07-22 NOTE — Brief Op Note (Signed)
07/22/2013  12:37 PM  PATIENT:  Philip [name]  62 y.o. male  PRE-OPERATIVE DIAGNOSIS:  BENIGN PROSTATIC HYPERTROPHY  POST-OPERATIVE DIAGNOSIS:  benign prostatic hypertrophy  PROCEDURE:  Procedure(s): TRANSURETHRAL vaporization OF THE PROSTATE WITH GYRUS INSTRUMENTS (N/A)  SURGEON:  Surgeon(s) and Role:    * Sebastian Ache, MD - Primary  PHYSICIAN ASSISTANT:   ASSISTANTS: none   ANESTHESIA:   general  EBL:     BLOOD ADMINISTERED:none  DRAINS: none   LOCAL MEDICATIONS USED:  NONE  SPECIMEN:  No Specimen  DISPOSITION OF SPECIMEN:  N/A  COUNTS:  YES  TOURNIQUET:  * No tourniquets in log *  DICTATION: .Other Dictation: Dictation Number [id number]  PLAN OF CARE: Admit for overnight observation  PATIENT DISPOSITION:  PACU - hemodynamically stable.   Delay start of Pharmacological VTE agent (>24hrs) due to surgical blood loss or risk of bleeding: yes

## 2013-07-22 NOTE — Anesthesia Postprocedure Evaluation (Signed)
  Anesthesia Post-op Note  Patient: Philip [name]  Procedure(s) Performed: Procedure(s) (LRB): TRANSURETHRAL vaporization OF THE PROSTATE WITH GYRUS INSTRUMENTS (N/A)  Patient Location: PACU  Anesthesia Type: General  Level of Consciousness: awake and alert   Airway and Oxygen Therapy: Patient Spontanous Breathing  Post-op Pain: mild  Post-op Assessment: Post-op Vital signs reviewed, Patient's Cardiovascular Status Stable, Respiratory Function Stable, Patent Airway and No signs of Nausea or vomiting  Last Vitals:  Filed Vitals:   07/22/13 1315  BP: 113/72  Pulse: 81  Temp:   Resp: 17    Post-op Vital Signs: stable   Complications: No apparent anesthesia complications

## 2013-07-22 NOTE — Care Management Note (Signed)
    Page 1 of 1   07/22/2013     2:23:29 PM   CARE MANAGEMENT NOTE 07/22/2013  Patient:  Philip [name], Philip [name]   Account Number:  [id number]  Date Initiated:  07/22/2013  Documentation initiated by:  Lanier Clam  Subjective/Objective Assessment:   62 Y/O M ADMITTED W/BPH.     Action/Plan:   FROM HOME.   Anticipated DC Date:  07/23/2013   Anticipated DC Plan:  HOME/SELF CARE      DC Planning Services  CM consult      Choice offered to / List presented to:             Status of service:  In process, will continue to follow Medicare Important Message given?   (If response is "NO", the following Medicare IM given date fields will be blank) Date Medicare IM given:   Date Additional Medicare IM given:    Discharge Disposition:    Per UR Regulation:  Reviewed for med. necessity/level of care/duration of stay  If discussed at Long Length of Stay Meetings, dates discussed:    Comments:  07/22/13 Dare Spillman RN,BSN NCM [phone] S/P TURP.NO ANTICIPATED D/C NEEDS.

## 2013-07-22 NOTE — Transfer of Care (Signed)
Immediate Anesthesia Transfer of Care Note  Patient: Philip [name]  Procedure(s) Performed: Procedure(s): TRANSURETHRAL vaporization OF THE PROSTATE WITH GYRUS INSTRUMENTS (N/A)  Patient Location: PACU  Anesthesia Type:General  Level of Consciousness: awake, alert , oriented and patient cooperative  Airway & Oxygen Therapy: Patient Spontanous Breathing and Patient connected to face mask oxygen  Post-op Assessment: Report given to PACU RN, Post -op Vital signs reviewed and stable and Patient moving all extremities X 4  Post vital signs: Reviewed and stable  Complications: No apparent anesthesia complications

## 2013-07-22 NOTE — H&P (Signed)
Philip [name] is an 62 y.o. male.    Chief Complaint: Pre-Op Transurethral Resection Prostate  HPI:   1 - Lower Urinary Tract Symptoms - Pt with long h/o progressive irritative and obstructive symptoms most bothored by frequncy and urgency with nocturia up to 8 per night with weak stream and hesitancy. DRE 05/2013 50gm, calc vol 45mL. PVR "18mL". Placed on tamsulosin per PCP and reports minimal improvement. Added finasteride to tamsulosin and reports still no improvement and some orthostasis. Cysto confirms bilobar prostatic hypertrophy.  2 - Prostate Cancer Screening - No [facility] prostate caner. Recent PSA History: 2014 - 2.75 (at PCP) / DRE 50gm smooth  3 - Microscopic Hematuria - Pt with blood on UA x several at PCP. No gross episdoes. 30PY smoker, still smokes. CT Urogram 06/2013 wtih tiny renal stone. Cysto 06/2013 with bilobar prostatic hypertrophy only.  4 - Nephrolithiasis - No prior colic. 4mm rt mid pole stone by CT urogram 06/2013, no hydro.  PMH sig for alcoholism, cocaine abuse, HTN. NO CV disease. No surgeries.   Today Philip [name] is seen to proceed with TURP for his refractory obstructive. voiding symptoms.   Past Medical History  Diagnosis Date  . Hypertension   . Substance abuse     EtOH, cocaine; last use 05/2012  . Alcohol dependence 04/23/2012  . Thyroid disease   . Hypothyroidism   . GERD (gastroesophageal reflux disease)   . Nocturia   . Dizziness     occasional for last week    Past Surgical History  Procedure Laterality Date  . No past surgeries      Family History  Problem Relation Age of Onset  . Cancer Mother     lung CA  . Hypertension Mother   . Hyperlipidemia Mother    Social History:  reports that he has been smoking Cigarettes.  He has a 21 pack-year smoking history. He has never used smokeless tobacco. He reports that he does not drink alcohol or use illicit drugs.  Allergies: No Known Allergies  No prescriptions prior to admission    Results  for orders placed during the hospital encounter of 07/20/13 (from the past 48 hour(s))  CBC     Status: None   Collection Time    07/20/13 11:30 AM      Result Value Range   WBC 6.4  4.0 - 10.5 K/uL   RBC 5.34  4.22 - 5.81 MIL/uL   Hemoglobin 14.9  13.0 - 17.0 g/dL   HCT 09.8  11.9 - 14.7 %   MCV 79.2  78.0 - 100.0 fL   MCH 27.9  26.0 - 34.0 pg   MCHC 35.2  30.0 - 36.0 g/dL   RDW 82.9  56.2 - 13.0 %   Platelets 337  150 - 400 K/uL  BASIC METABOLIC PANEL     Status: Abnormal   Collection Time    07/20/13 11:30 AM      Result Value Range   Sodium 136  135 - 145 mEq/L   Potassium 3.7  3.5 - 5.1 mEq/L   Chloride 100  96 - 112 mEq/L   CO2 28  19 - 32 mEq/L   Glucose, Bld 102 (*) 70 - 99 mg/dL   BUN 12  6 - 23 mg/dL   Creatinine, Ser 8.65  0.50 - 1.35 mg/dL   Calcium 9.9  8.4 - 78.4 mg/dL   GFR calc non Af Amer >90  >90 mL/min   GFR calc Af Amer >90  >  90 mL/min   Comment: (NOTE)     The eGFR has been calculated using the CKD EPI equation.     This calculation has not been validated in all clinical situations.     eGFR's persistently <90 mL/min signify possible Chronic Kidney     Disease.   No results found.  Review of Systems  Constitutional: Negative.  Negative for fever and chills.  HENT: Negative.   Eyes: Negative.   Respiratory: Negative.   Cardiovascular: Negative.   Gastrointestinal: Negative.   Genitourinary: Positive for urgency and frequency.  Musculoskeletal: Negative.   Skin: Negative.   Neurological: Negative.   Endo/Heme/Allergies: Negative.   Psychiatric/Behavioral: Negative.     There were no vitals taken for this visit. Physical Exam  Constitutional: He appears well-developed and well-nourished.  HENT:  Head: Normocephalic and atraumatic.  Eyes: EOM are normal. Pupils are equal, round, and reactive to light.  Neck: Normal range of motion. Neck supple.  Cardiovascular: Normal rate.   Respiratory: Effort normal.  GI: Soft.  Genitourinary: Penis  normal.  Musculoskeletal: Normal range of motion.  Neurological: He is alert.  Skin: Skin is warm and dry.  Psychiatric: He has a normal mood and affect. His behavior is normal. Judgment and thought content normal.     Assessment/Plan  1 - Lower Urinary Tract Symptoms - Minimal response to medical therapy for combination obstructive and irritative symptoms. He clearly has significant bilobar hypertrophy on exam and cysto. Discussed further options including outlet procedure such as TURP and he adamantly wants to proceed.   We rediscussed options for medical refractory prostatic outlet obstruction including TURP, TUNA, TUMT, Green-Light, Ho-LEP, and simple prostatectomy with their respective risks, benefits, and long-term outcomes data. Pt has opted for TURP. We rediscussed the typical peri-operative course with overnight admission and discharge with foley in place with subsequent office voiding trial few days later. We rediscussed risks including bleeding, infection, incontinence, need for repeat procedures / tissue regrowth over time as well as rare risks including DVT, PE, MI, CVA and mortality.   2 - Prostate Cancer Screening - Up to date this year, continue annual screening.   3 - Microscopic Hematuria - Eval wtih labs, exam, CT, cysto only with small rt sided non-obstructin renal stone as likely cause.  4 - Nephrolithiasis - very small and likley "passable" size stone w/o hydro or sequelae. Discussed observation v. treatment and he has opted for observation, this is reasonable.  Correna Meacham 07/22/2013, 6:40 AM

## 2013-07-22 NOTE — Anesthesia Preprocedure Evaluation (Addendum)
Anesthesia Evaluation  Patient identified by MRN, date of birth, ID band Patient awake    Reviewed: Allergy & Precautions, H&P , NPO status , Patient's Chart, lab work & pertinent test results  Airway Mallampati: II TM Distance: >3 FB Neck ROM: Full    Dental  (+) Dental Advisory Given   Pulmonary neg pulmonary ROS,  breath sounds clear to auscultation        Cardiovascular hypertension, Pt. on medications Rhythm:Regular Rate:Normal     Neuro/Psych PSYCHIATRIC DISORDERS negative neurological ROS     GI/Hepatic Neg liver ROS, GERD-  Medicated,  Endo/Other  Hypothyroidism Hyperthyroidism   Renal/GU negative Renal ROS     Musculoskeletal negative musculoskeletal ROS (+)   Abdominal   Peds  Hematology negative hematology ROS (+)   Anesthesia Other Findings   Reproductive/Obstetrics                          Anesthesia Physical Anesthesia Plan  ASA: II  Anesthesia Plan: General   Post-op Pain Management:    Induction: Intravenous  Airway Management Planned: LMA  Additional Equipment:   Intra-op Plan:   Post-operative Plan: Extubation in OR  Informed Consent: I have reviewed the patients History and Physical, chart, labs and discussed the procedure including the risks, benefits and alternatives for the proposed anesthesia with the patient or authorized representative who has indicated his/her understanding and acceptance.   Dental advisory given  Plan Discussed with: CRNA  Anesthesia Plan Comments:         Anesthesia Quick Evaluation

## 2013-07-23 ENCOUNTER — Encounter (HOSPITAL_COMMUNITY): Payer: Self-pay | Admitting: Urology

## 2013-07-23 LAB — HEMOGLOBIN AND HEMATOCRIT, BLOOD
HCT: 35.6 % — ABNORMAL LOW (ref 39.0–52.0)
Hemoglobin: 12.2 g/dL — ABNORMAL LOW (ref 13.0–17.0)

## 2013-07-23 MED ORDER — OXYCODONE-ACETAMINOPHEN 5-325 MG PO TABS: 1.0000 | ORAL_TABLET | ORAL | Status: AC | PRN

## 2013-07-23 MED ORDER — SENNOSIDES-DOCUSATE SODIUM 8.6-50 MG PO TABS: 1.0000 | ORAL_TABLET | Freq: Two times a day (BID) | ORAL | Status: AC

## 2013-07-23 MED ORDER — SULFAMETHOXAZOLE-TMP DS 800-160 MG PO TABS: 1.0000 | ORAL_TABLET | Freq: Two times a day (BID) | ORAL | Status: AC

## 2013-07-23 NOTE — Discharge Summary (Signed)
Physician Discharge Summary  Patient ID: Philip [name] MRN: [id number] DOB/AGE: 61-May-1952 61 y.o.  Admit date: 07/22/2013 Discharge date: 07/23/2013  Admission Diagnoses: Prostatic Hypertrophy with Refractory Obstructive Voiding Symptoms  Discharge Diagnoses: Prostatic Hypertrophy with Refractory Obstructive Voiding Symptoms   Discharged Condition: good  Hospital Course:   1 - Lower Urinary Tract Symptoms - Pt with long h/o progressive irritative and obstructive symptoms most bothored by frequncy and urgency with nocturia up to 8 per night with weak stream and hesitancy. DRE 05/2013 50gm, calc vol 45mL. PVR "18mL". Placed on tamsulosin per PCP and reports minimal improvement. Added finasteride to tamsulosin and reports still no improvement and some orthostasis. Cysto confirms bilobar prostatic hypertrophy.   Underwent transurethral vaporization of prostate on 10/15, the day of admission without acute complications. Was placed in observation overnight. By POD 1, the day of discharge, he was ambulatory, tolerating regular diet, Hgb stable at >12, catheter draining well off irrigation, and felt to be adequate for discharge.    Consults: None  Significant Diagnostic Studies: labs: Hgb >12  Treatments: surgery: transurethral vaporization of prostate on 10/15  Discharge Exam: Blood pressure 119/65, pulse 79, temperature 97.9 F (36.6 C), temperature source Oral, resp. rate 18, height 5\' 7"  (1.702 m), weight 63.504 kg (140 lb), SpO2 94.00%. General appearance: alert, cooperative and appears stated age Head: Normocephalic, without obvious abnormality, atraumatic Eyes: conjunctivae/corneas clear. PERRL, EOM's intact. Fundi benign. Ears: normal TM's and external ear canals both ears Nose: Nares normal. Septum midline. Mucosa normal. No drainage or sinus tenderness. Throat: lips, mucosa, and tongue normal; teeth and gums normal Neck: no adenopathy, no carotid bruit, no JVD, supple,  symmetrical, trachea midline and thyroid not enlarged, symmetric, no tenderness/mass/nodules Back: symmetric, no curvature. ROM normal. No CVA tenderness. Resp: clear to auscultation bilaterally Chest wall: no tenderness Cardio: regular rate and rhythm, S1, S2 normal, no murmur, click, rub or gallop GI: soft, non-tender; bowel sounds normal; no masses,  no organomegaly Male genitalia: normal, foley c/d/i with dark yellow urine off irrigation. No paraphimosis. Extremities: extremities normal, atraumatic, no cyanosis or edema Pulses: 2+ and symmetric Skin: Skin color, texture, turgor normal. No rashes or lesions Lymph nodes: Cervical, supraclavicular, and axillary nodes normal. Neurologic: Grossly normal  Disposition: 01-Home or Self Care     Medication List         acetaminophen 500 MG tablet  Commonly known as:  TYLENOL  Take 1,000 mg by mouth every 6 (six) hours as needed for pain.     chlorhexidine 4 % external liquid  Commonly known as:  HIBICLENS  Apply 60 mLs topically daily. Uses daily in am per md for body odor.     losartan-hydrochlorothiazide 100-25 MG per tablet  Commonly known as:  HYZAAR  Take 1 tablet by mouth every morning.     methimazole 10 MG tablet  Commonly known as:  TAPAZOLE  Take 20 mg by mouth every morning.     multivitamin with minerals Tabs tablet  Take 1 tablet by mouth daily.     OVER THE COUNTER MEDICATION  Take 1 tablet by mouth 2 (two) times daily as needed. Acid reducer. Pt does not know name of drug.     oxyCODONE-acetaminophen 5-325 MG per tablet  Commonly known as:  ROXICET  Take 1 tablet by mouth every 4 (four) hours as needed for pain. Postoperatively.     senna-docusate 8.6-50 MG per tablet  Commonly known as:  Senokot-S  Take 1 tablet by mouth 2 (  two) times daily. While taking pain meds to prevent constipation     sulfamethoxazole-trimethoprim 800-160 MG per tablet  Commonly known as:  BACTRIM DS  Take 1 tablet by mouth 2  (two) times daily. X 7 days to prevent post-op infection     VITAMIN B-12 PO  Take 180 mLs by mouth daily.           Follow-up Information   Follow up with Sebastian Ache, MD On 07/27/2013. (as scheduled for catheter removal)    Specialty:  Urology   Contact information:   [address] [place] [address] [phone]       Signed: Sebastian Ache 07/23/2013, 8:02 AM

## 2013-07-23 NOTE — Op Note (Signed)
NAME:  Philip, [name] NO.:  [id number]  MEDICAL RECORD NO.:  [id number]  LOCATION:  1411                         FACILITY:  [facility]  PHYSICIAN:  Sebastian Ache, MD     DATE OF BIRTH:  08-Jan-1951  DATE OF PROCEDURE: 07/22/2013 DATE OF DISCHARGE:                              OPERATIVE REPORT   DIAGNOSES:  Prostatic hypertrophy, medication refractory with bothersome urinary tract symptoms.  PROCEDURE:  Transurethral vaporization of the prostate.  SPECIMENS:  None.  FINDINGS: 1. Moderate bilobar prostatic hypertrophy. 2. Wide open urinary channel from the bladder neck to the area of     verumontanum post vaporization.  DRAINS:  22-French 3-way Foley catheter straight drain.  The patient had normal saline irrigation 1 drop per second.  Efflux light pink.  ESTIMATED BLOOD LOSS:  50 mL.  INDICATION:  Mr. Philip [name] is a pleasant 62 year old gentleman with history of obstructive urinary tract symptoms including weak stream, hesitancy, as well as some irritative symptoms as well.  These have been medication refractory including alpha blockers and 5 alpha reductase inhibitors. He underwent office cystoscopy, was corroborated moderate bilobar prostatic hypertrophy.  He had minimal postresidual urine.  Options were discussed including continued medical therapy versus observation versus outlet procedure endoscopically versus simple prostatectomy and he wished to proceed with endoscopic outlet procedure with transurethral resection versus vaporization.  Informed consent was obtained and placed in the medical record.  PROCEDURE IN DETAIL:  The patient being Philip [name], procedure being transurethral vaporization of prostate was confirmed, procedure carried out.  Time-out was performed.  Intravenous antibiotics were administered.  General LMA anesthesia was introduced.  The patient was placed into a low lithotomy position.  Sterile field was created by prepping and  draping the patient's penis, perineum, and proximal thighs using iodine x3.  Next, cystourethroscopy was performed using a 26- [place] A[employer] continuous flow resectoscope sheath with visual obturator. Inspection of the anterior and posterior urethra revealed moderate bilobar hypertrophy from the area of the verumontanum to the bladder neck.  Inspection of the urinary bladder revealed no diverticula, calcifications, papular lesions.  Ureteral orifices in the normal anatomic position.  Given the severely enlarged bilobar hypertrophy, decision was made to use a vaporization technique and as such the button electrode was used to perform vaporization of the bladder neck to the verumontanum in a top down approach first concentrating on the 12 o'clock position then the 6 o'clock position within the right lobe and the left lobe carrying down what appeared to be the circular fibrous tissue of the prostatic capsule.  Great care was taken to avoid resection distal to the verumontanum.  This did not occur.  Following these maneuvers, there was excellent channel from the area of bladder next to the area of verumontanum.  There was no evidence of bladder or prostatic capsule perforation.  Hemostasis appeared excellent.  Ureteral orifices were directly visualized and found to be uninjured.  There was no evidence of bladder perforation.  The resectoscope was then exchanged for a 22-French 3-way Foley catheter.  25 mL of sterile water in the balloon, the patient received normal saline irrigation 1 drop per second, and procedure was then terminated.  The patient tolerated the procedure well.  There were no immediate periprocedural complications. The patient was taken to postanesthesia care unit in a stable condition.          ______________________________ Sebastian Ache, MD     TM/MEDQ  D:  07/22/2013  T:  07/23/2013  Job:  [id number]

## 2013-07-23 NOTE — Plan of Care (Signed)
Problem: Phase II Progression Outcomes Goal: Foley discontinued if ordered Outcome: Not Applicable Date Met:  07/23/13 Pt dc'd with foley

## 2013-07-30 ENCOUNTER — Encounter: Payer: Self-pay | Admitting: Family Medicine

## 2013-07-30 DIAGNOSIS — R12 Heartburn: Secondary | ICD-10-CM | POA: Insufficient documentation

## 2013-08-29 ENCOUNTER — Other Ambulatory Visit: Payer: Self-pay | Admitting: Physician Assistant

## 2013-08-29 NOTE — Telephone Encounter (Signed)
Needs refill on losartan.

## 2013-08-29 NOTE — Telephone Encounter (Signed)
Wants to know why the pharmacy has to call us each time that he needs a refill. Would like a refill for 6 mos or a year, so that the pharmacy does not have to call each time.

## 2013-08-31 NOTE — Telephone Encounter (Signed)
Needs OV, labs 

## 2013-09-03 ENCOUNTER — Encounter: Payer: Self-pay | Admitting: Family Medicine

## 2013-09-03 DIAGNOSIS — N200 Calculus of kidney: Secondary | ICD-10-CM

## 2013-09-03 DIAGNOSIS — R3129 Other microscopic hematuria: Secondary | ICD-10-CM

## 2013-10-13 ENCOUNTER — Encounter: Payer: Self-pay | Admitting: Emergency Medicine

## 2013-10-13 ENCOUNTER — Ambulatory Visit (INDEPENDENT_AMBULATORY_CARE_PROVIDER_SITE_OTHER): Payer: BC Managed Care – PPO | Admitting: Emergency Medicine

## 2013-10-13 VITALS — BP 158/84 | HR 66 | Temp 98.2°F | Resp 16 | Ht 65.0 in | Wt 151.0 lb

## 2013-10-13 DIAGNOSIS — F17200 Nicotine dependence, unspecified, uncomplicated: Secondary | ICD-10-CM

## 2013-10-13 DIAGNOSIS — I10 Essential (primary) hypertension: Secondary | ICD-10-CM

## 2013-10-13 DIAGNOSIS — E0590 Thyrotoxicosis, unspecified without thyrotoxic crisis or storm: Secondary | ICD-10-CM

## 2013-10-13 DIAGNOSIS — Z23 Encounter for immunization: Secondary | ICD-10-CM

## 2013-10-13 MED ORDER — LOSARTAN POTASSIUM-HCTZ 100-25 MG PO TABS: 1.0000 | ORAL_TABLET | Freq: Every day | ORAL | Status: DC

## 2013-10-13 NOTE — Patient Instructions (Signed)

## 2013-10-13 NOTE — Progress Notes (Signed)
[facility] [address], [place] [id number] [phone]  Date:  10/13/2013   Name:  Philip SallesHarvey G Vanderloop   DOB:  Aug 01, 1951   MRN:  [id number]  PCP:  Norberto SorensonSHAW,EVA, MD    Chief Complaint: Medication Refill   History of Present Illness:  Philip [name] is a 63 y.o. very pleasant male patient who presents with the following:  Comes to the office with need for antihypertensive refill.  Had TURP in October and did well.  No evidence of end organ injury by history.  Smokes 1 1/2 PPD and would like to stop but doesn't know how.  Retiring in one month. Denies other complaint or health concern today.   Patient Active Problem List   Diagnosis Date Noted  . Microscopic hematuria 09/03/2013  . Heartburn 07/30/2013  . Nephrolithiasis 06/08/2013  . Hematuria 05/06/2013  . Tobacco use disorder 05/06/2013  . Hyperthyroidism 11/29/2012  . Benign prostatic hypertrophy (BPH) with nocturia 11/29/2012  . HTN (hypertension) 11/27/2012  . Substance abuse     Past Medical History  Diagnosis Date  . Hypertension   . Substance abuse     EtOH, cocaine; last use 05/2012  . Alcohol dependence 04/23/2012  . Thyroid disease   . Hypothyroidism   . GERD (gastroesophageal reflux disease)   . Nocturia   . Dizziness     occasional for last week  . Nephrolithiasis 06/08/2013    No prior colic. 4mm rt mid pole stone by CT urogram, no hydro.     Past Surgical History  Procedure Laterality Date  . Transurethral resection of prostate N/A 07/22/2013    Procedure: TRANSURETHRAL vaporization OF THE PROSTATE WITH GYRUS INSTRUMENTS;  Surgeon: Sebastian Acheheodore Manny, MD;  Location: [facility];  Service: Urology;  Laterality: N/A;    History  Substance Use Topics  . Smoking status: Current Every Day Smoker -- 1.00 packs/day for 21 years    Types: Cigarettes  . Smokeless tobacco: Never Used     Comment: has cut down to 1 ppd, electronic cigarettes for last month  . Alcohol Use: No     Comment: No EtOH  since 05/2012    Family History  Problem Relation Age of Onset  . Cancer Mother     lung CA  . Hypertension Mother   . Hyperlipidemia Mother     No Known Allergies  Medication list has been reviewed and updated.  Current Outpatient Prescriptions on File Prior to Visit  Medication Sig Dispense Refill  . chlorhexidine (HIBICLENS) 4 % external liquid Apply 60 mLs topically daily. Uses daily in am per md for body odor.      Marland Kitchen. losartan-hydrochlorothiazide (HYZAAR) 100-25 MG per tablet Take 1 tablet by mouth every morning.      . methimazole (TAPAZOLE) 10 MG tablet Take 20 mg by mouth every morning.      . Multiple Vitamin (MULTIVITAMIN WITH MINERALS) TABS tablet Take 1 tablet by mouth daily.      Marland Kitchen. acetaminophen (TYLENOL) 500 MG tablet Take 1,000 mg by mouth every 6 (six) hours as needed for pain.      . Cyanocobalamin (VITAMIN B-12 PO) Take 180 mLs by mouth daily.      Marland Kitchen. losartan-hydrochlorothiazide (HYZAAR) 100-25 MG per tablet Take 1 tablet by mouth daily. NEED VISIT, LABS!  90 tablet  0  . OVER THE COUNTER MEDICATION Take 1 tablet by mouth 2 (two) times daily as needed. Acid reducer. Pt does not know name of  drug.      . oxyCODONE-acetaminophen (ROXICET) 5-325 MG per tablet Take 1 tablet by mouth every 4 (four) hours as needed for pain. Postoperatively.  30 tablet  0  . senna-docusate (SENOKOT-S) 8.6-50 MG per tablet Take 1 tablet by mouth 2 (two) times daily. While taking pain meds to prevent constipation  30 tablet  1  . sulfamethoxazole-trimethoprim (BACTRIM DS) 800-160 MG per tablet Take 1 tablet by mouth 2 (two) times daily. X 7 days to prevent post-op infection  14 tablet  0   No current facility-administered medications on file prior to visit.    Review of Systems:  As per HPI, otherwise negative.    Physical Examination: Filed Vitals:   10/13/13 1322  BP: 158/84  Pulse: 66  Temp: 98.2 F (36.8 C)  Resp: 16   Filed Vitals:   10/13/13 1322  Height: 5\' 5"  (1.651 m)   Weight: 151 lb (68.493 kg)   Body mass index is 25.13 kg/(m^2). Ideal Body Weight: Weight in (lb) to have BMI = 25: 149.9  GEN: WDWN, NAD, Non-toxic, A & O x 3 HEENT: Atraumatic, Normocephalic. Neck supple. No masses, No LAD. Ears and Nose: No external deformity. CV: RRR, No M/G/R. No JVD. No thrill. No extra heart sounds. PULM: CTA B, no wheezes, crackles, rhonchi. No retractions. No resp. distress. No accessory muscle use. ABD: S, NT, ND, +BS. No rebound. No HSM. EXTR: No c/c/e NEURO Normal gait.  PSYCH: Normally interactive. Conversant. Not depressed or anxious appearing.  Calm demeanor.    Assessment and Plan: Hypertension Needs lipid draw.  Signed,  Phillips Odor, MD

## 2013-10-26 ENCOUNTER — Other Ambulatory Visit (INDEPENDENT_AMBULATORY_CARE_PROVIDER_SITE_OTHER): Payer: BC Managed Care – PPO | Admitting: Family Medicine

## 2013-10-26 DIAGNOSIS — F17200 Nicotine dependence, unspecified, uncomplicated: Secondary | ICD-10-CM

## 2013-10-26 DIAGNOSIS — I10 Essential (primary) hypertension: Secondary | ICD-10-CM

## 2013-10-26 DIAGNOSIS — E0590 Thyrotoxicosis, unspecified without thyrotoxic crisis or storm: Secondary | ICD-10-CM

## 2013-10-26 LAB — CBC WITH DIFFERENTIAL/PLATELET
BASOS ABS: 0 10*3/uL (ref 0.0–0.1)
Basophils Relative: 1 % (ref 0–1)
Eosinophils Absolute: 0.1 10*3/uL (ref 0.0–0.7)
Eosinophils Relative: 2 % (ref 0–5)
HCT: 45.5 % (ref 39.0–52.0)
HEMOGLOBIN: 15.8 g/dL (ref 13.0–17.0)
LYMPHS PCT: 32 % (ref 12–46)
Lymphs Abs: 2.1 10*3/uL (ref 0.7–4.0)
MCH: 29.1 pg (ref 26.0–34.0)
MCHC: 34.7 g/dL (ref 30.0–36.0)
MCV: 83.8 fL (ref 78.0–100.0)
MONO ABS: 0.4 10*3/uL (ref 0.1–1.0)
MONOS PCT: 6 % (ref 3–12)
NEUTROS ABS: 3.9 10*3/uL (ref 1.7–7.7)
Neutrophils Relative %: 59 % (ref 43–77)
Platelets: 345 10*3/uL (ref 150–400)
RBC: 5.43 MIL/uL (ref 4.22–5.81)
RDW: 16.8 % — ABNORMAL HIGH (ref 11.5–15.5)
WBC: 6.6 10*3/uL (ref 4.0–10.5)

## 2013-10-26 LAB — COMPREHENSIVE METABOLIC PANEL
ALBUMIN: 4.3 g/dL (ref 3.5–5.2)
ALT: 23 U/L (ref 0–53)
AST: 26 U/L (ref 0–37)
Alkaline Phosphatase: 116 U/L (ref 39–117)
BUN: 17 mg/dL (ref 6–23)
CALCIUM: 9.5 mg/dL (ref 8.4–10.5)
CHLORIDE: 96 meq/L (ref 96–112)
CO2: 26 mEq/L (ref 19–32)
Creat: 0.89 mg/dL (ref 0.50–1.35)
Glucose, Bld: 112 mg/dL — ABNORMAL HIGH (ref 70–99)
POTASSIUM: 3.8 meq/L (ref 3.5–5.3)
SODIUM: 133 meq/L — AB (ref 135–145)
Total Bilirubin: 0.4 mg/dL (ref 0.3–1.2)
Total Protein: 7.2 g/dL (ref 6.0–8.3)

## 2013-10-26 LAB — LIPID PANEL
Cholesterol: 247 mg/dL — ABNORMAL HIGH (ref 0–200)
HDL: 80 mg/dL (ref >39)
LDL Cholesterol: 140 mg/dL — ABNORMAL HIGH (ref 0–99)
Total CHOL/HDL Ratio: 3.1 Ratio
Triglycerides: 137 mg/dL (ref <150)
VLDL: 27 mg/dL (ref 0–40)

## 2013-10-26 NOTE — Progress Notes (Signed)
Patient here for labs only. 

## 2013-10-27 ENCOUNTER — Other Ambulatory Visit: Payer: Self-pay | Admitting: Emergency Medicine

## 2013-10-27 MED ORDER — ATORVASTATIN CALCIUM 20 MG PO TABS: 20.0000 mg | ORAL_TABLET | Freq: Every day | ORAL | Status: AC

## 2013-11-23 ENCOUNTER — Other Ambulatory Visit: Payer: Self-pay | Admitting: Emergency Medicine

## 2014-01-03 ENCOUNTER — Telehealth: Payer: Self-pay

## 2014-01-03 NOTE — Telephone Encounter (Signed)
PATIENT STATES HE SAW DR. Dareen PianoANDERSON IN January FOR HIGH BLOOD PRESSURE. HE NEEDS TO GET A REFILL ON LOSARTAN - HCTZ 100/25. PLEASE CALL HIM WHEN IT HAS BEEN CALLED INTO HIS PHARMACY. BEST PHONE [phone] (HOME)   PHARMACY CHOICE IS [facility] ON [address].   MBC

## 2014-01-04 NOTE — Telephone Encounter (Signed)
Advised pt to call pharmacy. Patient has one refill left there.

## 2014-01-06 ENCOUNTER — Telehealth: Payer: Self-pay

## 2014-01-06 MED ORDER — HYDROCHLOROTHIAZIDE 25 MG PO TABS: 25.0000 mg | ORAL_TABLET | Freq: Every day | ORAL | Status: DC

## 2014-01-06 MED ORDER — LOSARTAN POTASSIUM 100 MG PO TABS: 100.0000 mg | ORAL_TABLET | Freq: Every day | ORAL | Status: DC

## 2014-01-06 NOTE — Telephone Encounter (Signed)
Received fax from pharm requesting separate Rxs for losartan and HCTZ. Checked w/pt who verified he would like these Rxs sent as separate Rxs instead of the comb Hyzaar d/t lower cost. Sending in Rxs.

## 2014-04-12 ENCOUNTER — Other Ambulatory Visit: Payer: Self-pay | Admitting: Emergency Medicine

## 2014-04-12 NOTE — Telephone Encounter (Signed)
Losartan 100 mg 90 day supply  Please advise

## 2014-05-18 ENCOUNTER — Telehealth: Payer: Self-pay | Admitting: *Deleted

## 2014-05-18 MED ORDER — HYDROCHLOROTHIAZIDE 25 MG PO TABS: 25.0000 mg | ORAL_TABLET | Freq: Every day | ORAL | Status: AC

## 2014-05-18 MED ORDER — LOSARTAN POTASSIUM 100 MG PO TABS: 100.0000 mg | ORAL_TABLET | Freq: Every day | ORAL | Status: AC

## 2014-05-18 NOTE — Telephone Encounter (Signed)
Pharmacist called and asked for a refill.  Refills sent in but pt needs office visit

## 2014-06-12 IMAGING — CR DG RIBS W/ CHEST 3+V*R*
3 series · 3 of 3 positions shown · non-contrast
Comparison: Chest x-ray dated 12/17/2009

CLINICAL DATA: Right-sided chest pain.

RIGHT RIBS AND CHEST - 3+ VIEW

[PA]
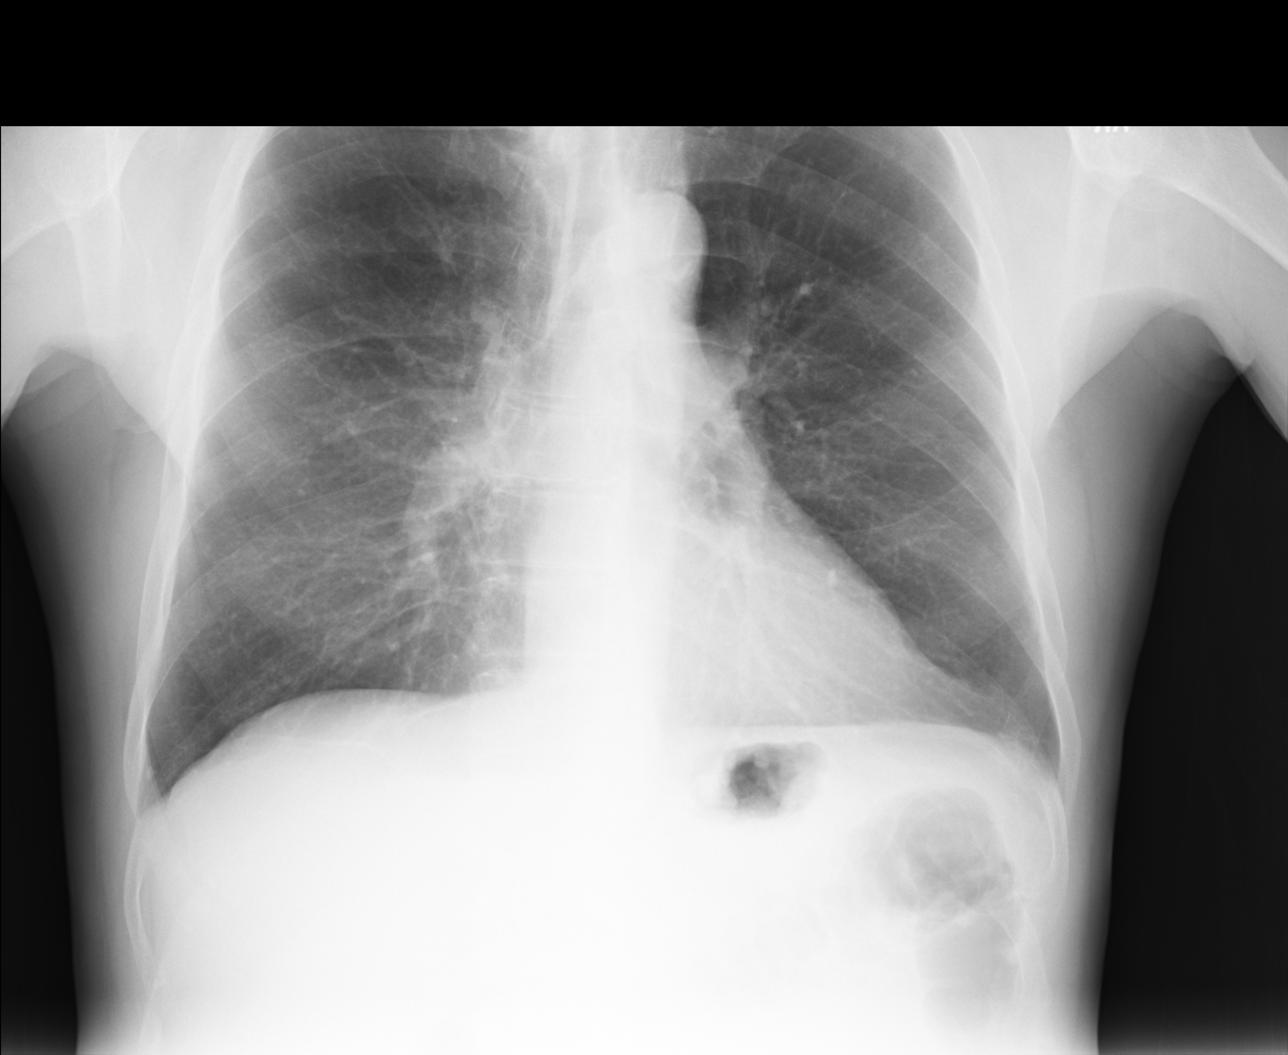

[lao]
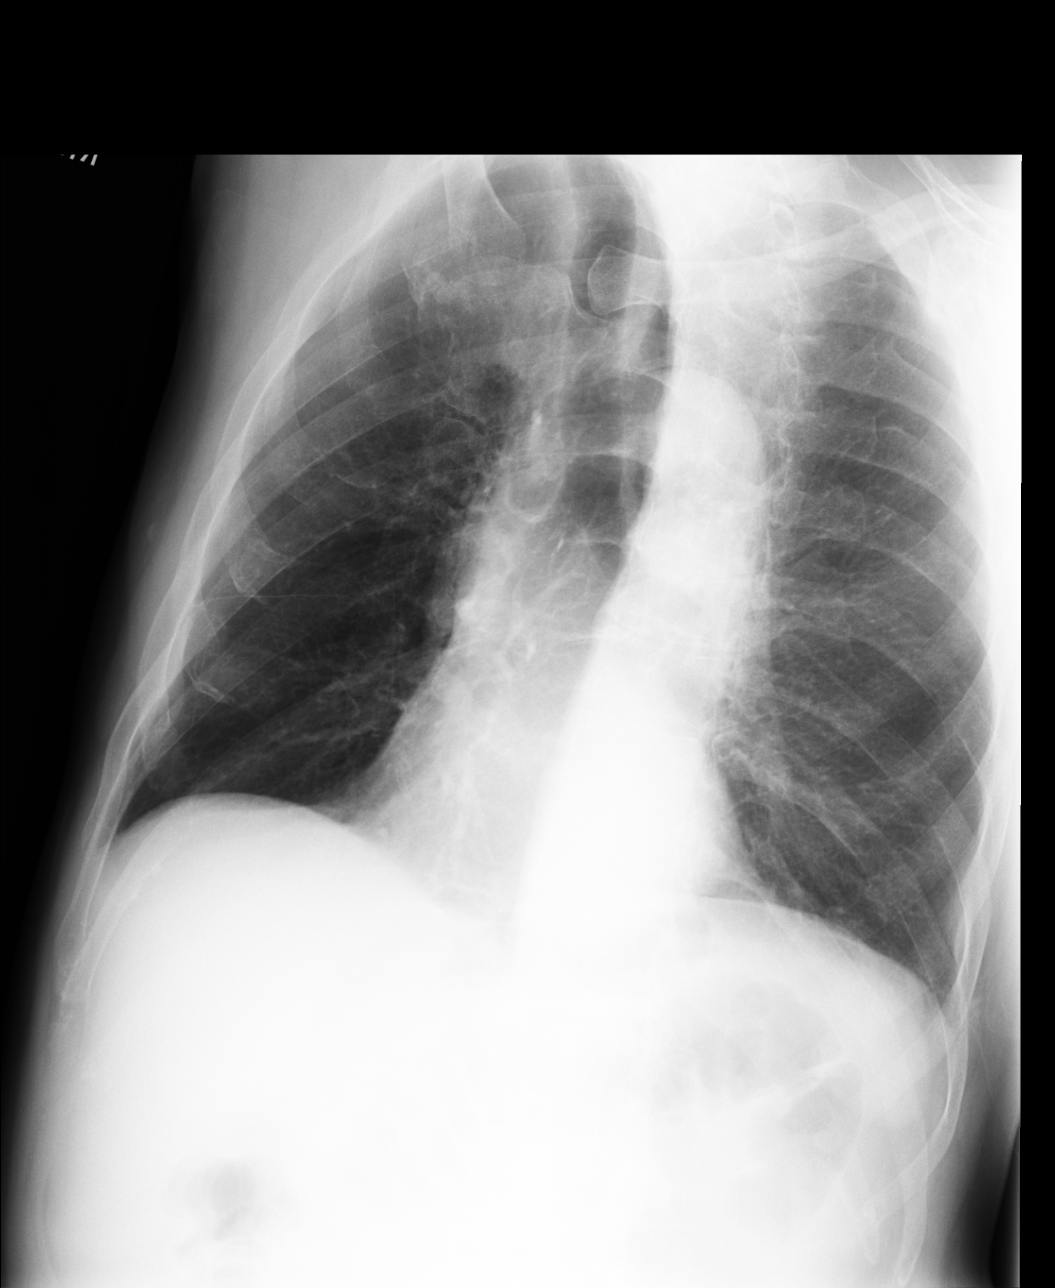

[rao]
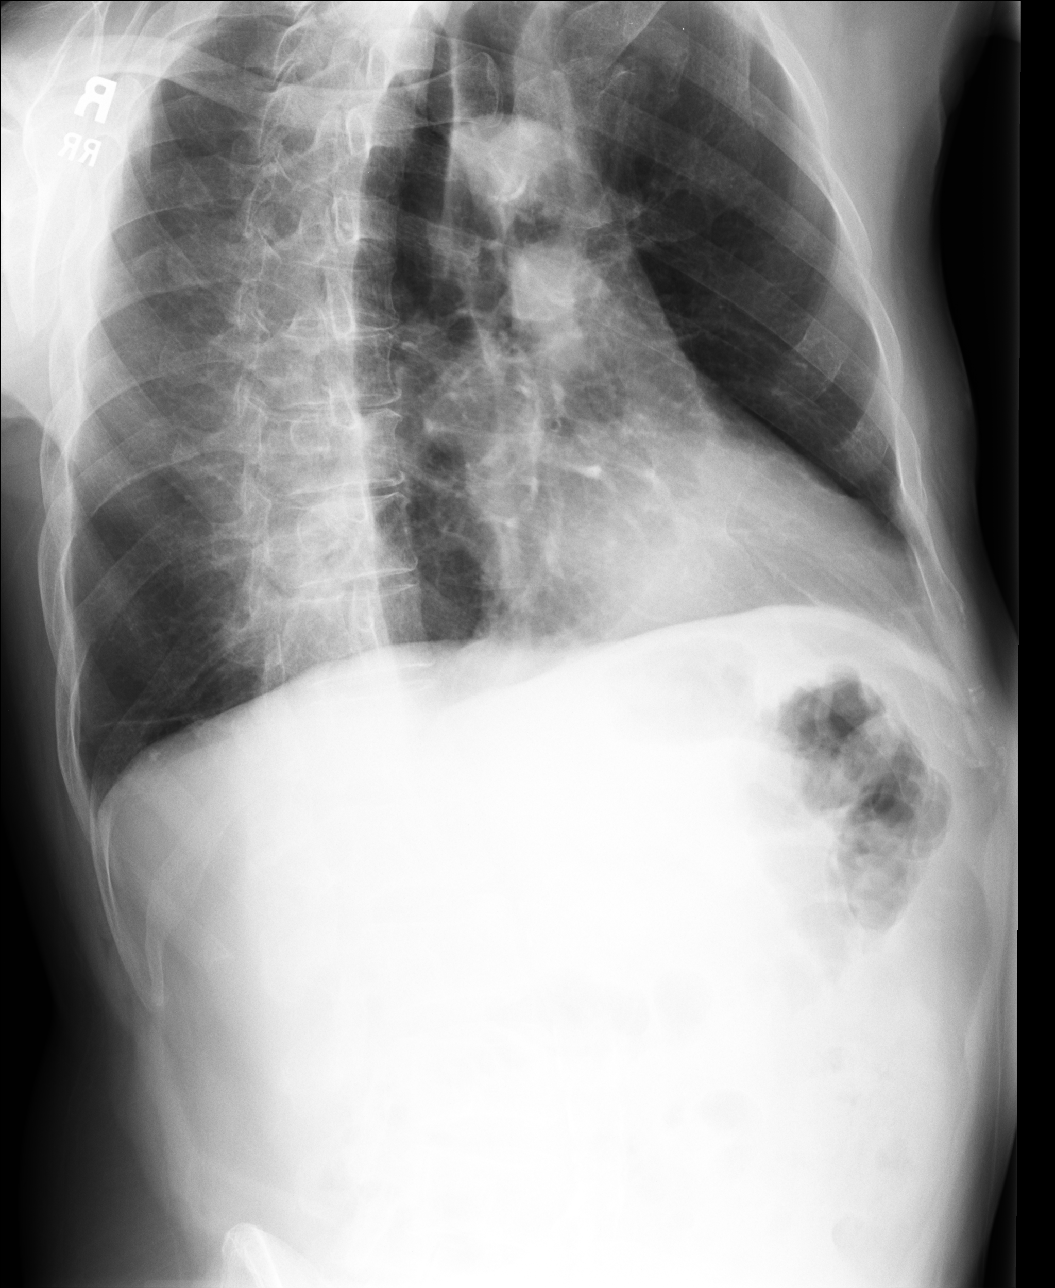

[3 of 3 positions shown; findings below may reference images not displayed]

FINDINGS: Heart size and vascularity are normal. Lungs are clear
except for a tiny granuloma at the right base laterally.  There is
slight flattening of the diaphragms suggesting emphysema.

There is minimal irregularity of the anterior aspect of the right
third rib which could be due to an old injury but this does not
have the appearance of an acute rib fracture.
IMPRESSION: No acute abnormalities. Possible emphysema.

Clinically significant discrepancy from primary report, if
provided: None

## 2015-07-11 IMAGING — CR DG CHEST 2V
2 series · 2 of 2 positions shown · non-contrast
Comparison: 04/21/2012

CLINICAL DATA: Cough.

CHEST - 2 VIEW

[PA]
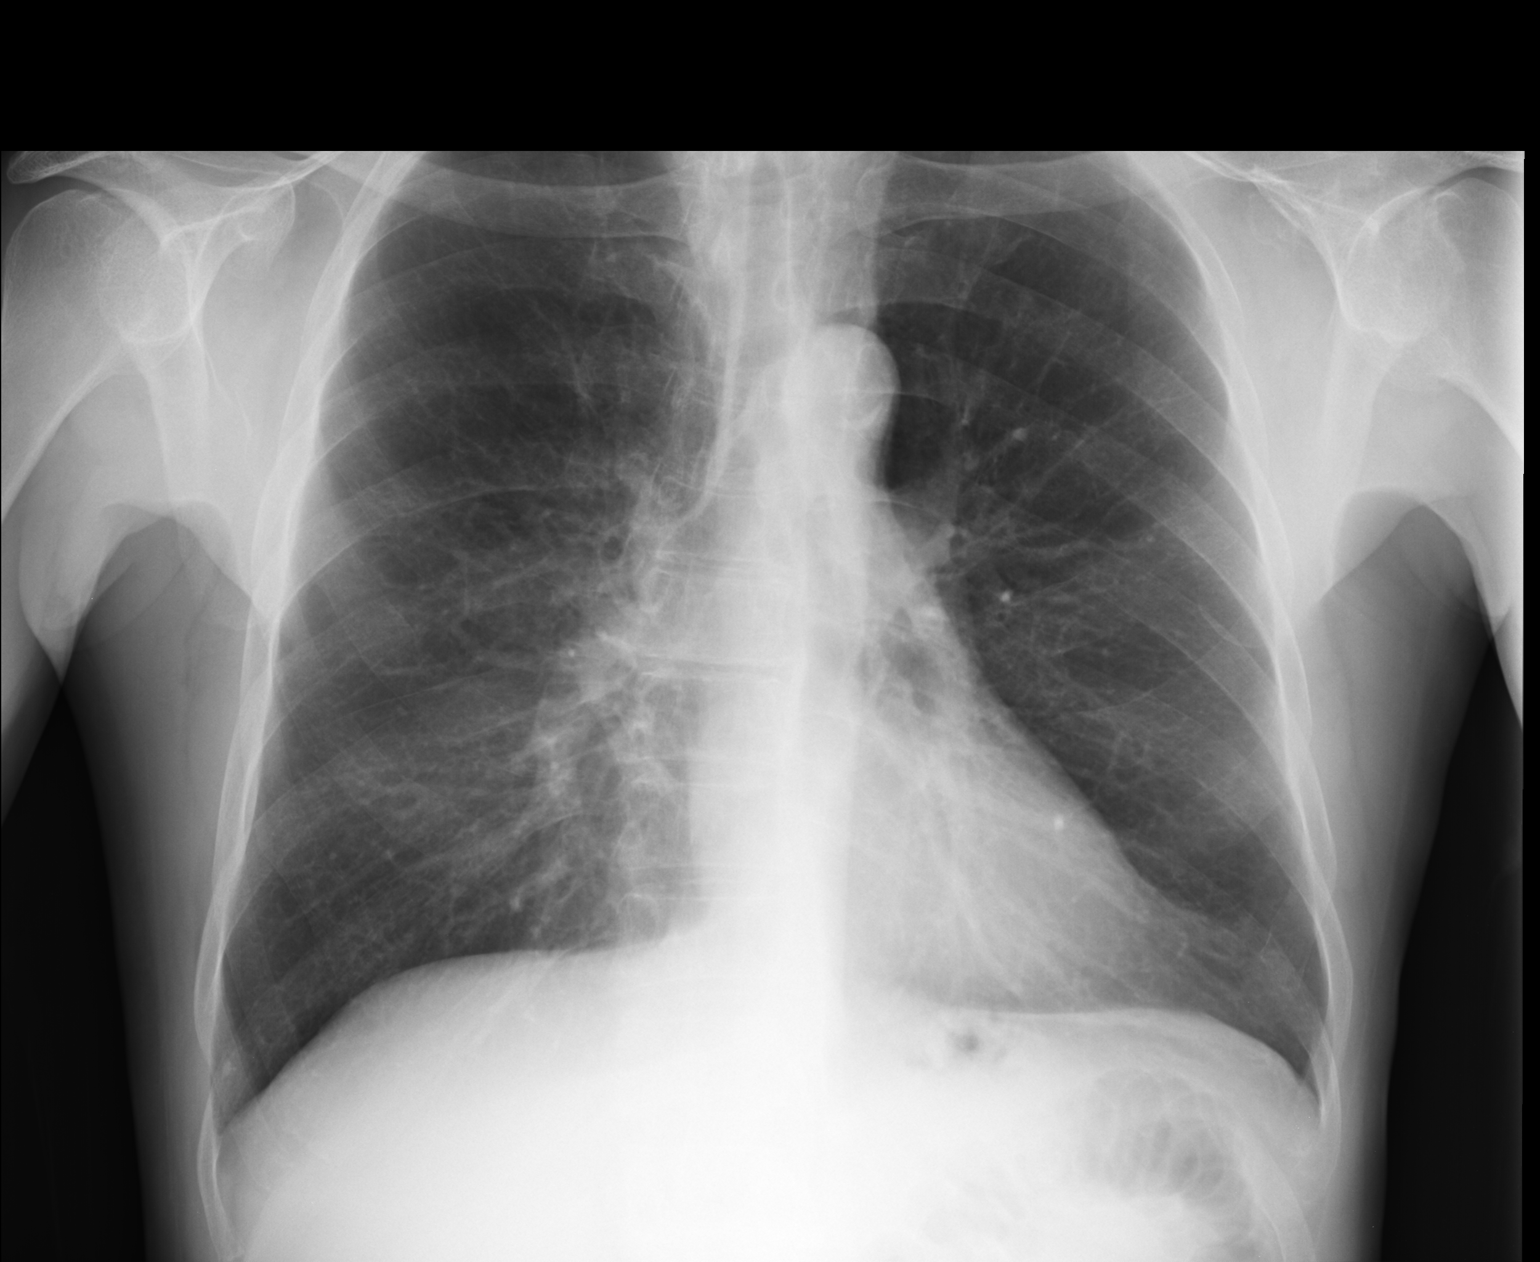

[lateral]
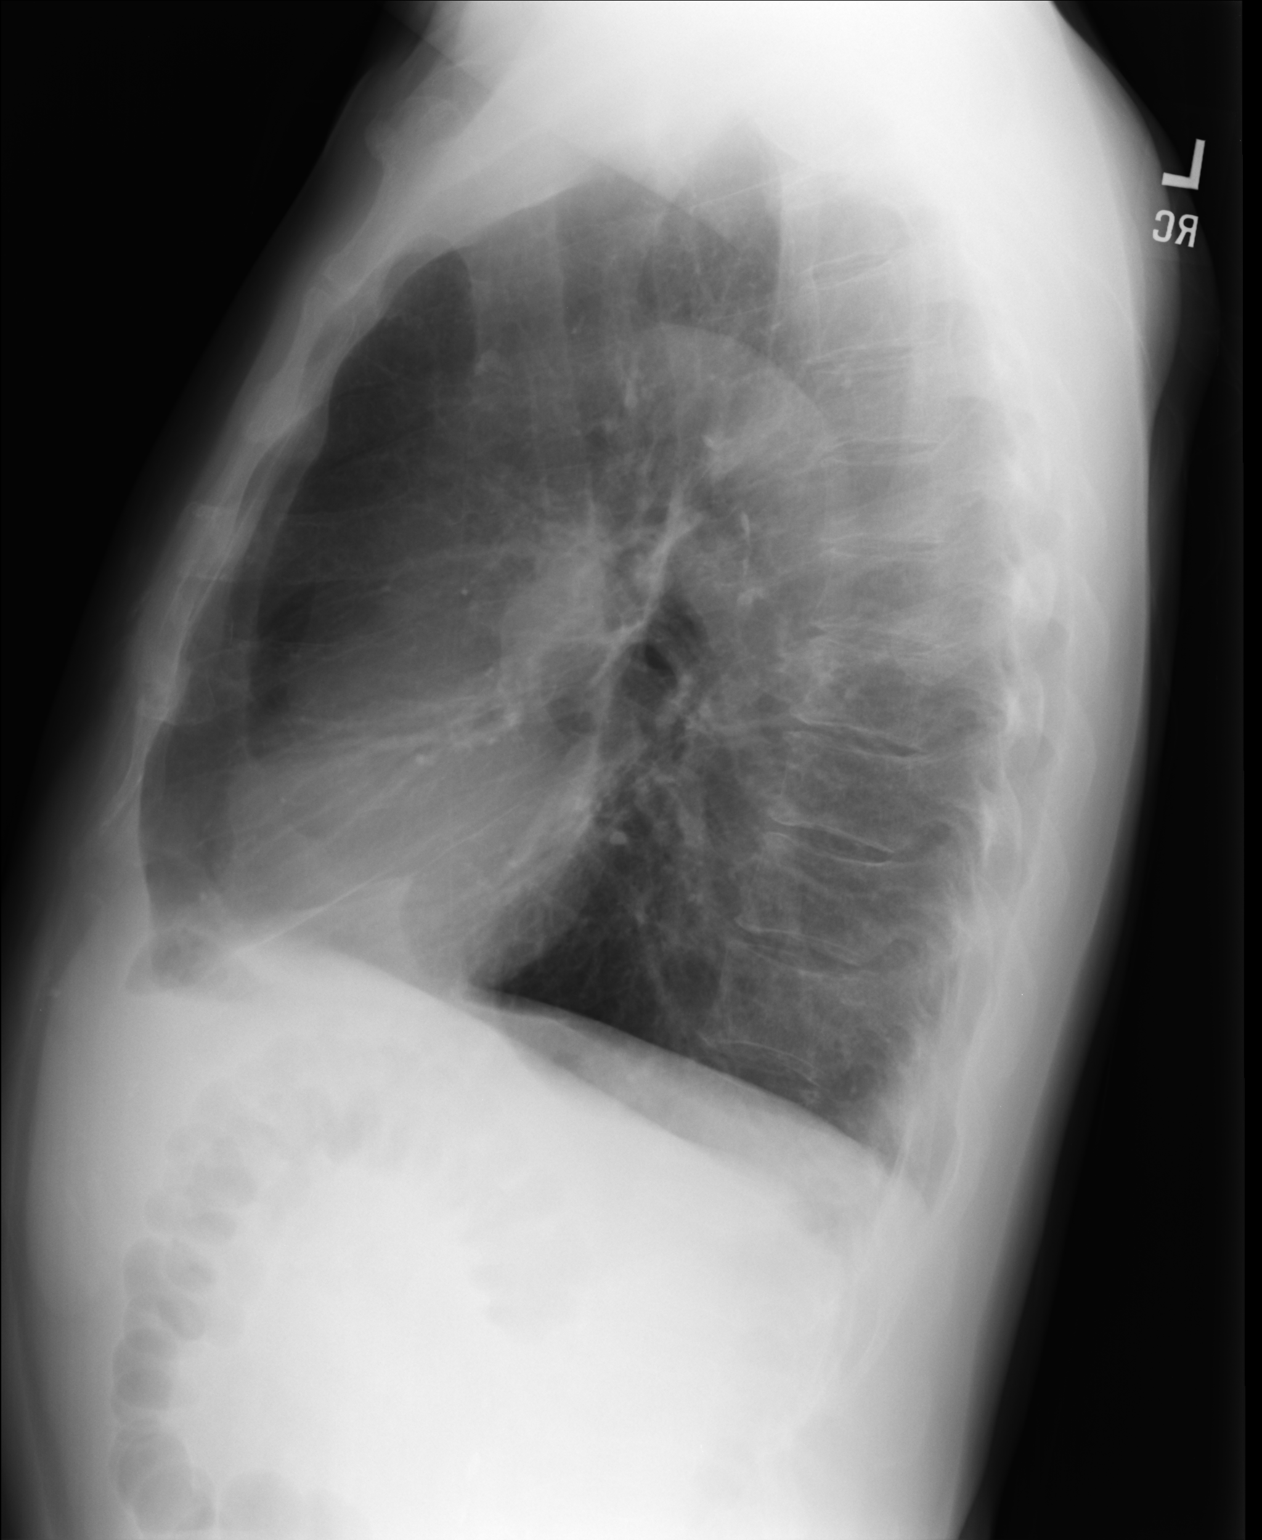

[2 of 2 positions shown; findings below may reference images not displayed]

FINDINGS: The lungs hyperexpanded with flattened hemidiaphragms
consistent with COPD.  There is no infiltrate or pulmonary edema.
No lung mass or suspicious nodule is seen.

The heart, mediastinum and hila are unremarkable.
IMPRESSION: No acute cardiopulmonary disease.

COPD.

Clinically significant discrepancy from primary report, if
provided: None

## 2018-08-21 ENCOUNTER — Encounter: Payer: Self-pay | Admitting: Specialist

## 2018-10-23 DIAGNOSIS — R351 Nocturia: Secondary | ICD-10-CM | POA: Diagnosis not present

## 2018-10-23 DIAGNOSIS — R5383 Other fatigue: Secondary | ICD-10-CM | POA: Diagnosis not present

## 2018-10-23 DIAGNOSIS — I10 Essential (primary) hypertension: Secondary | ICD-10-CM | POA: Diagnosis not present

## 2018-10-23 DIAGNOSIS — E875 Hyperkalemia: Secondary | ICD-10-CM | POA: Diagnosis not present

## 2018-10-30 DIAGNOSIS — E782 Mixed hyperlipidemia: Secondary | ICD-10-CM | POA: Diagnosis not present

## 2023-12-06 DIAGNOSIS — Z0000 Encounter for general adult medical examination without abnormal findings: Secondary | ICD-10-CM | POA: Diagnosis not present

## 2023-12-06 DIAGNOSIS — Z23 Encounter for immunization: Secondary | ICD-10-CM | POA: Diagnosis not present

## 2023-12-06 DIAGNOSIS — I10 Essential (primary) hypertension: Secondary | ICD-10-CM | POA: Diagnosis not present

## 2023-12-06 DIAGNOSIS — R7303 Prediabetes: Secondary | ICD-10-CM | POA: Diagnosis not present

## 2023-12-06 DIAGNOSIS — F17200 Nicotine dependence, unspecified, uncomplicated: Secondary | ICD-10-CM | POA: Diagnosis not present

## 2023-12-06 DIAGNOSIS — Z125 Encounter for screening for malignant neoplasm of prostate: Secondary | ICD-10-CM | POA: Diagnosis not present

## 2023-12-06 DIAGNOSIS — E039 Hypothyroidism, unspecified: Secondary | ICD-10-CM | POA: Diagnosis not present

## 2023-12-06 DIAGNOSIS — E785 Hyperlipidemia, unspecified: Secondary | ICD-10-CM | POA: Diagnosis not present

## 2023-12-06 DIAGNOSIS — G4700 Insomnia, unspecified: Secondary | ICD-10-CM | POA: Diagnosis not present

## 2024-01-14 DIAGNOSIS — R3915 Urgency of urination: Secondary | ICD-10-CM | POA: Diagnosis not present

## 2024-01-14 DIAGNOSIS — Z125 Encounter for screening for malignant neoplasm of prostate: Secondary | ICD-10-CM | POA: Diagnosis not present

## 2024-02-28 DIAGNOSIS — Z1211 Encounter for screening for malignant neoplasm of colon: Secondary | ICD-10-CM | POA: Diagnosis not present

## 2024-03-12 DIAGNOSIS — I10 Essential (primary) hypertension: Secondary | ICD-10-CM | POA: Diagnosis not present

## 2024-04-06 DIAGNOSIS — E785 Hyperlipidemia, unspecified: Secondary | ICD-10-CM | POA: Diagnosis not present

## 2024-04-06 DIAGNOSIS — E039 Hypothyroidism, unspecified: Secondary | ICD-10-CM | POA: Diagnosis not present

## 2024-04-06 DIAGNOSIS — I10 Essential (primary) hypertension: Secondary | ICD-10-CM | POA: Diagnosis not present

## 2024-04-06 DIAGNOSIS — E890 Postprocedural hypothyroidism: Secondary | ICD-10-CM | POA: Diagnosis not present

## 2024-04-10 DIAGNOSIS — I10 Essential (primary) hypertension: Secondary | ICD-10-CM | POA: Diagnosis not present

## 2024-05-07 DIAGNOSIS — E890 Postprocedural hypothyroidism: Secondary | ICD-10-CM | POA: Diagnosis not present

## 2024-05-07 DIAGNOSIS — E039 Hypothyroidism, unspecified: Secondary | ICD-10-CM | POA: Diagnosis not present

## 2024-05-07 DIAGNOSIS — I10 Essential (primary) hypertension: Secondary | ICD-10-CM | POA: Diagnosis not present

## 2024-05-07 DIAGNOSIS — E785 Hyperlipidemia, unspecified: Secondary | ICD-10-CM | POA: Diagnosis not present

## 2024-05-10 DIAGNOSIS — I10 Essential (primary) hypertension: Secondary | ICD-10-CM | POA: Diagnosis not present

## 2024-06-04 DIAGNOSIS — I10 Essential (primary) hypertension: Secondary | ICD-10-CM | POA: Diagnosis not present

## 2024-06-04 DIAGNOSIS — E039 Hypothyroidism, unspecified: Secondary | ICD-10-CM | POA: Diagnosis not present

## 2024-06-04 DIAGNOSIS — Z23 Encounter for immunization: Secondary | ICD-10-CM | POA: Diagnosis not present

## 2024-06-04 DIAGNOSIS — G4700 Insomnia, unspecified: Secondary | ICD-10-CM | POA: Diagnosis not present

## 2024-06-04 DIAGNOSIS — E785 Hyperlipidemia, unspecified: Secondary | ICD-10-CM | POA: Diagnosis not present

## 2024-06-04 DIAGNOSIS — F17210 Nicotine dependence, cigarettes, uncomplicated: Secondary | ICD-10-CM | POA: Diagnosis not present

## 2024-06-04 DIAGNOSIS — R7303 Prediabetes: Secondary | ICD-10-CM | POA: Diagnosis not present

## 2024-06-07 DIAGNOSIS — E785 Hyperlipidemia, unspecified: Secondary | ICD-10-CM | POA: Diagnosis not present

## 2024-06-07 DIAGNOSIS — E039 Hypothyroidism, unspecified: Secondary | ICD-10-CM | POA: Diagnosis not present

## 2024-06-07 DIAGNOSIS — E890 Postprocedural hypothyroidism: Secondary | ICD-10-CM | POA: Diagnosis not present

## 2024-06-07 DIAGNOSIS — I10 Essential (primary) hypertension: Secondary | ICD-10-CM | POA: Diagnosis not present

## 2024-06-09 DIAGNOSIS — I10 Essential (primary) hypertension: Secondary | ICD-10-CM | POA: Diagnosis not present

## 2024-07-07 DIAGNOSIS — I10 Essential (primary) hypertension: Secondary | ICD-10-CM | POA: Diagnosis not present

## 2024-07-07 DIAGNOSIS — E039 Hypothyroidism, unspecified: Secondary | ICD-10-CM | POA: Diagnosis not present

## 2024-07-07 DIAGNOSIS — E785 Hyperlipidemia, unspecified: Secondary | ICD-10-CM | POA: Diagnosis not present

## 2024-07-07 DIAGNOSIS — E890 Postprocedural hypothyroidism: Secondary | ICD-10-CM | POA: Diagnosis not present

## 2024-07-09 DIAGNOSIS — I10 Essential (primary) hypertension: Secondary | ICD-10-CM | POA: Diagnosis not present

## 2024-08-07 DIAGNOSIS — E785 Hyperlipidemia, unspecified: Secondary | ICD-10-CM | POA: Diagnosis not present

## 2024-08-07 DIAGNOSIS — E890 Postprocedural hypothyroidism: Secondary | ICD-10-CM | POA: Diagnosis not present

## 2024-08-07 DIAGNOSIS — I10 Essential (primary) hypertension: Secondary | ICD-10-CM | POA: Diagnosis not present

## 2024-08-07 DIAGNOSIS — E039 Hypothyroidism, unspecified: Secondary | ICD-10-CM | POA: Diagnosis not present

## 2024-08-08 DIAGNOSIS — I10 Essential (primary) hypertension: Secondary | ICD-10-CM | POA: Diagnosis not present

## 2024-09-06 DIAGNOSIS — E890 Postprocedural hypothyroidism: Secondary | ICD-10-CM | POA: Diagnosis not present

## 2024-09-06 DIAGNOSIS — E785 Hyperlipidemia, unspecified: Secondary | ICD-10-CM | POA: Diagnosis not present

## 2024-09-06 DIAGNOSIS — E039 Hypothyroidism, unspecified: Secondary | ICD-10-CM | POA: Diagnosis not present

## 2024-09-06 DIAGNOSIS — I10 Essential (primary) hypertension: Secondary | ICD-10-CM | POA: Diagnosis not present
